# Patient Record
Sex: Female | Born: 1985 | Race: Black or African American | Hispanic: No | Marital: Single | Smoking: Never smoker
Health system: Southern US, Community
[De-identification: ages and names within clinical notes are randomized; demographics above are authoritative.]

## PROBLEM LIST (undated history)

## (undated) DIAGNOSIS — J90 Pleural effusion, not elsewhere classified: Secondary | ICD-10-CM

## (undated) DIAGNOSIS — I509 Heart failure, unspecified: Secondary | ICD-10-CM

## (undated) DIAGNOSIS — E119 Type 2 diabetes mellitus without complications: Secondary | ICD-10-CM

## (undated) DIAGNOSIS — E669 Obesity, unspecified: Secondary | ICD-10-CM

## (undated) DIAGNOSIS — N179 Acute kidney failure, unspecified: Secondary | ICD-10-CM

## (undated) DIAGNOSIS — R000 Tachycardia, unspecified: Secondary | ICD-10-CM

## (undated) DIAGNOSIS — J189 Pneumonia, unspecified organism: Secondary | ICD-10-CM

## (undated) DIAGNOSIS — I5021 Acute systolic (congestive) heart failure: Secondary | ICD-10-CM

## (undated) HISTORY — PX: LEFT VENTRICULAR ASSIST DEVICE: SHX2679

## (undated) HISTORY — PX: APPENDECTOMY: SHX54

---

## 2014-08-08 ENCOUNTER — Inpatient Hospital Stay (HOSPITAL_COMMUNITY): Payer: BC Managed Care – PPO

## 2014-08-08 ENCOUNTER — Inpatient Hospital Stay (HOSPITAL_COMMUNITY)
Admission: EM | Admit: 2014-08-08 | Discharge: 2014-08-19 | DRG: 291 | Disposition: A | Discharge status: Home / Self Care | Payer: BC Managed Care – PPO | Attending: Internal Medicine | Admitting: Internal Medicine

## 2014-08-08 ENCOUNTER — Emergency Department (HOSPITAL_COMMUNITY): Payer: BC Managed Care – PPO

## 2014-08-08 ENCOUNTER — Encounter (HOSPITAL_COMMUNITY): Payer: Self-pay | Admitting: Emergency Medicine

## 2014-08-08 DIAGNOSIS — J90 Pleural effusion, not elsewhere classified: Secondary | ICD-10-CM | POA: Diagnosis present

## 2014-08-08 DIAGNOSIS — E1165 Type 2 diabetes mellitus with hyperglycemia: Secondary | ICD-10-CM | POA: Diagnosis present

## 2014-08-08 DIAGNOSIS — K5900 Constipation, unspecified: Secondary | ICD-10-CM

## 2014-08-08 DIAGNOSIS — Y95 Nosocomial condition: Secondary | ICD-10-CM | POA: Diagnosis present

## 2014-08-08 DIAGNOSIS — R1115 Cyclical vomiting syndrome unrelated to migraine: Secondary | ICD-10-CM

## 2014-08-08 DIAGNOSIS — E876 Hypokalemia: Secondary | ICD-10-CM | POA: Diagnosis present

## 2014-08-08 DIAGNOSIS — N189 Chronic kidney disease, unspecified: Secondary | ICD-10-CM | POA: Diagnosis present

## 2014-08-08 DIAGNOSIS — D509 Iron deficiency anemia, unspecified: Secondary | ICD-10-CM | POA: Diagnosis present

## 2014-08-08 DIAGNOSIS — J189 Pneumonia, unspecified organism: Secondary | ICD-10-CM | POA: Diagnosis present

## 2014-08-08 DIAGNOSIS — N179 Acute kidney failure, unspecified: Secondary | ICD-10-CM | POA: Diagnosis present

## 2014-08-08 DIAGNOSIS — I5021 Acute systolic (congestive) heart failure: Secondary | ICD-10-CM | POA: Diagnosis not present

## 2014-08-08 DIAGNOSIS — I319 Disease of pericardium, unspecified: Secondary | ICD-10-CM

## 2014-08-08 DIAGNOSIS — D649 Anemia, unspecified: Secondary | ICD-10-CM | POA: Diagnosis present

## 2014-08-08 DIAGNOSIS — I3139 Other pericardial effusion (noninflammatory): Secondary | ICD-10-CM | POA: Diagnosis present

## 2014-08-08 DIAGNOSIS — E118 Type 2 diabetes mellitus with unspecified complications: Secondary | ICD-10-CM | POA: Diagnosis present

## 2014-08-08 DIAGNOSIS — I429 Cardiomyopathy, unspecified: Secondary | ICD-10-CM | POA: Diagnosis present

## 2014-08-08 DIAGNOSIS — R112 Nausea with vomiting, unspecified: Secondary | ICD-10-CM | POA: Diagnosis not present

## 2014-08-08 DIAGNOSIS — K5901 Slow transit constipation: Secondary | ICD-10-CM

## 2014-08-08 DIAGNOSIS — Z791 Long term (current) use of non-steroidal anti-inflammatories (NSAID): Secondary | ICD-10-CM

## 2014-08-08 DIAGNOSIS — R000 Tachycardia, unspecified: Secondary | ICD-10-CM | POA: Diagnosis present

## 2014-08-08 DIAGNOSIS — E119 Type 2 diabetes mellitus without complications: Secondary | ICD-10-CM

## 2014-08-08 DIAGNOSIS — I129 Hypertensive chronic kidney disease with stage 1 through stage 4 chronic kidney disease, or unspecified chronic kidney disease: Secondary | ICD-10-CM | POA: Diagnosis present

## 2014-08-08 DIAGNOSIS — J810 Acute pulmonary edema: Secondary | ICD-10-CM

## 2014-08-08 DIAGNOSIS — I313 Pericardial effusion (noninflammatory): Secondary | ICD-10-CM | POA: Diagnosis present

## 2014-08-08 DIAGNOSIS — Z833 Family history of diabetes mellitus: Secondary | ICD-10-CM

## 2014-08-08 DIAGNOSIS — E669 Obesity, unspecified: Secondary | ICD-10-CM | POA: Diagnosis present

## 2014-08-08 DIAGNOSIS — A599 Trichomoniasis, unspecified: Secondary | ICD-10-CM | POA: Diagnosis present

## 2014-08-08 DIAGNOSIS — R0602 Shortness of breath: Secondary | ICD-10-CM | POA: Diagnosis not present

## 2014-08-08 HISTORY — DX: Acute kidney failure, unspecified: N17.9

## 2014-08-08 HISTORY — DX: Pleural effusion, not elsewhere classified: J90

## 2014-08-08 HISTORY — DX: Acute systolic (congestive) heart failure: I50.21

## 2014-08-08 HISTORY — DX: Tachycardia, unspecified: R00.0

## 2014-08-08 HISTORY — DX: Type 2 diabetes mellitus without complications: E11.9

## 2014-08-08 HISTORY — DX: Pneumonia, unspecified organism: J18.9

## 2014-08-08 HISTORY — DX: Obesity, unspecified: E66.9

## 2014-08-08 LAB — CBC WITH DIFFERENTIAL/PLATELET
Basophils Absolute: 0 10*3/uL (ref 0.0–0.1)
Basophils Relative: 0 % (ref 0–1)
Eosinophils Absolute: 0.1 10*3/uL (ref 0.0–0.7)
Eosinophils Relative: 1 % (ref 0–5)
HCT: 28.3 % — ABNORMAL LOW (ref 36.0–46.0)
Hemoglobin: 10 g/dL — ABNORMAL LOW (ref 12.0–15.0)
Lymphocytes Relative: 16 % (ref 12–46)
Lymphs Abs: 1.4 10*3/uL (ref 0.7–4.0)
MCH: 29.9 pg (ref 26.0–34.0)
MCHC: 35.3 g/dL (ref 30.0–36.0)
MCV: 84.5 fL (ref 78.0–100.0)
Monocytes Absolute: 0.4 10*3/uL (ref 0.1–1.0)
Monocytes Relative: 5 % (ref 3–12)
Neutro Abs: 6.9 10*3/uL (ref 1.7–7.7)
Neutrophils Relative %: 78 % — ABNORMAL HIGH (ref 43–77)
Platelets: 312 10*3/uL (ref 150–400)
RBC: 3.35 MIL/uL — ABNORMAL LOW (ref 3.87–5.11)
RDW: 11.6 % (ref 11.5–15.5)
WBC: 8.9 10*3/uL (ref 4.0–10.5)

## 2014-08-08 LAB — TROPONIN I
Troponin I: <0.3 ng/mL (ref <0.30)
Troponin I: <0.3 ng/mL (ref <0.30)

## 2014-08-08 LAB — GLUCOSE, CAPILLARY
Glucose-Capillary: 203 mg/dL — ABNORMAL HIGH (ref 70–99)
Glucose-Capillary: 146 mg/dL — ABNORMAL HIGH (ref 70–99)
Glucose-Capillary: 196 mg/dL — ABNORMAL HIGH (ref 70–99)

## 2014-08-08 LAB — RETICULOCYTES
RBC.: 3.06 MIL/uL — ABNORMAL LOW (ref 3.87–5.11)
Retic Count, Absolute: 76.5 10*3/uL (ref 19.0–186.0)
Retic Ct Pct: 2.5 % (ref 0.4–3.1)

## 2014-08-08 LAB — BASIC METABOLIC PANEL
Anion gap: 13 (ref 5–15)
BUN: 8 mg/dL (ref 6–23)
CO2: 25 mEq/L (ref 19–32)
Calcium: 8.9 mg/dL (ref 8.4–10.5)
Chloride: 99 mEq/L (ref 96–112)
Creatinine, Ser: 0.79 mg/dL (ref 0.50–1.10)
GFR calc Af Amer: >90 mL/min (ref >90)
GFR calc non Af Amer: >90 mL/min (ref >90)
Glucose, Bld: 205 mg/dL — ABNORMAL HIGH (ref 70–99)
Potassium: 4.1 mEq/L (ref 3.7–5.3)
Sodium: 137 mEq/L (ref 137–147)

## 2014-08-08 LAB — IRON AND TIBC
Iron: 35 ug/dL — ABNORMAL LOW (ref 42–135)
Saturation Ratios: 17 % — ABNORMAL LOW (ref 20–55)
TIBC: 207 ug/dL — ABNORMAL LOW (ref 250–470)
UIBC: 172 ug/dL (ref 125–400)

## 2014-08-08 LAB — TSH: TSH: 1.14 u[IU]/mL (ref 0.350–4.500)

## 2014-08-08 LAB — HEMOGLOBIN A1C
Hgb A1c MFr Bld: 9.4 % — ABNORMAL HIGH (ref <5.7)
Mean Plasma Glucose: 223 mg/dL — ABNORMAL HIGH (ref <117)

## 2014-08-08 LAB — FOLATE: Folate: 13.6 ng/mL

## 2014-08-08 LAB — VITAMIN B12: Vitamin B-12: 305 pg/mL (ref 211–911)

## 2014-08-08 LAB — CBG MONITORING, ED: Glucose-Capillary: 194 mg/dL — ABNORMAL HIGH (ref 70–99)

## 2014-08-08 LAB — FERRITIN: Ferritin: 246 ng/mL (ref 10–291)

## 2014-08-08 LAB — POC URINE PREG, ED: Preg Test, Ur: NEGATIVE

## 2014-08-08 LAB — PRO B NATRIURETIC PEPTIDE: Pro B Natriuretic peptide (BNP): 2629 pg/mL — ABNORMAL HIGH (ref 0–125)

## 2014-08-08 MED ORDER — ONDANSETRON HCL 4 MG/2ML IJ SOLN
4.0000 mg | Freq: Four times a day (QID) | INTRAMUSCULAR | Status: DC | PRN
  Administered 2014-08-09 – 2014-08-17 (×14): 4 mg via INTRAVENOUS
  Filled 2014-08-08 (×19): qty 2

## 2014-08-08 MED ORDER — HYDROCODONE-ACETAMINOPHEN 5-325 MG PO TABS: 1.0000 | ORAL_TABLET | ORAL | Status: DC | PRN

## 2014-08-08 MED ORDER — CEFTRIAXONE SODIUM 1 G IJ SOLR
1.0000 g | Freq: Once | INTRAMUSCULAR | Status: AC
  Administered 2014-08-08: 1 g via INTRAVENOUS
  Filled 2014-08-08: qty 10

## 2014-08-08 MED ORDER — POTASSIUM CHLORIDE IN NACL 20-0.9 MEQ/L-% IV SOLN
INTRAVENOUS | Status: DC
Start: 2014-08-08 — End: 2014-08-08
  Administered 2014-08-08: 50 mL/h via INTRAVENOUS
  Filled 2014-08-08: qty 1000

## 2014-08-08 MED ORDER — ACETAMINOPHEN 650 MG RE SUPP: 650.0000 mg | Freq: Four times a day (QID) | RECTAL | Status: DC | PRN

## 2014-08-08 MED ORDER — ACETAMINOPHEN 325 MG PO TABS
650.0000 mg | ORAL_TABLET | Freq: Four times a day (QID) | ORAL | Status: DC | PRN
Start: 2014-08-08 — End: 2014-08-19

## 2014-08-08 MED ORDER — ONDANSETRON HCL 4 MG PO TABS
4.0000 mg | ORAL_TABLET | Freq: Four times a day (QID) | ORAL | Status: DC | PRN
  Filled 2014-08-08 (×4): qty 1

## 2014-08-08 MED ORDER — LEVALBUTEROL HCL 0.63 MG/3ML IN NEBU
0.6300 mg | INHALATION_SOLUTION | Freq: Four times a day (QID) | RESPIRATORY_TRACT | Status: DC
  Administered 2014-08-08 – 2014-08-10 (×10): 0.63 mg via RESPIRATORY_TRACT
  Filled 2014-08-08 (×11): qty 3

## 2014-08-08 MED ORDER — BENZONATATE 100 MG PO CAPS
100.0000 mg | ORAL_CAPSULE | Freq: Three times a day (TID) | ORAL | Status: DC
  Administered 2014-08-08 – 2014-08-09 (×5): 100 mg via ORAL
  Filled 2014-08-08 (×11): qty 1

## 2014-08-08 MED ORDER — GUAIFENESIN-DM 100-10 MG/5ML PO SYRP: 5.0000 mL | ORAL_SOLUTION | ORAL | Status: DC | PRN

## 2014-08-08 MED ORDER — ONDANSETRON 8 MG PO TBDP
8.0000 mg | ORAL_TABLET | Freq: Once | ORAL | Status: AC
  Administered 2014-08-08: 8 mg via ORAL
  Filled 2014-08-08: qty 1

## 2014-08-08 MED ORDER — CEFEPIME HCL 1 G IJ SOLR
1.0000 g | Freq: Two times a day (BID) | INTRAMUSCULAR | Status: DC
  Filled 2014-08-08 (×4): qty 1

## 2014-08-08 MED ORDER — ALUM & MAG HYDROXIDE-SIMETH 200-200-20 MG/5ML PO SUSP: 30.0000 mL | Freq: Four times a day (QID) | ORAL | Status: DC | PRN

## 2014-08-08 MED ORDER — AZITHROMYCIN 250 MG PO TABS
500.0000 mg | ORAL_TABLET | Freq: Every day | ORAL | Status: DC
  Filled 2014-08-08: qty 2

## 2014-08-08 MED ORDER — VANCOMYCIN HCL IN DEXTROSE 1-5 GM/200ML-% IV SOLN
1000.0000 mg | Freq: Three times a day (TID) | INTRAVENOUS | Status: DC
  Administered 2014-08-08 – 2014-08-10 (×6): 1000 mg via INTRAVENOUS
  Filled 2014-08-08 (×7): qty 200

## 2014-08-08 MED ORDER — MORPHINE SULFATE 2 MG/ML IJ SOLN
2.0000 mg | INTRAMUSCULAR | Status: DC | PRN
  Administered 2014-08-18: 2 mg via INTRAVENOUS
  Filled 2014-08-08: qty 1

## 2014-08-08 MED ORDER — ONDANSETRON HCL 4 MG/2ML IJ SOLN
4.0000 mg | Freq: Once | INTRAMUSCULAR | Status: AC
  Administered 2014-08-08: 4 mg via INTRAVENOUS
  Filled 2014-08-08: qty 2

## 2014-08-08 MED ORDER — IOHEXOL 300 MG/ML  SOLN
80.0000 mL | Freq: Once | INTRAMUSCULAR | Status: AC | PRN
  Administered 2014-08-08: 80 mL via INTRAVENOUS

## 2014-08-08 MED ORDER — ENOXAPARIN SODIUM 40 MG/0.4ML ~~LOC~~ SOLN
40.0000 mg | SUBCUTANEOUS | Status: DC
Start: 2014-08-08 — End: 2014-08-15
  Administered 2014-08-08 – 2014-08-15 (×8): 40 mg via SUBCUTANEOUS
  Filled 2014-08-08 (×8): qty 0.4

## 2014-08-08 MED ORDER — DEXTROSE 5 % IV SOLN
1.0000 g | Freq: Three times a day (TID) | INTRAVENOUS | Status: DC
  Administered 2014-08-08 – 2014-08-10 (×7): 1 g via INTRAVENOUS
  Filled 2014-08-08 (×7): qty 1

## 2014-08-08 MED ORDER — VANCOMYCIN HCL IN DEXTROSE 1-5 GM/200ML-% IV SOLN: 1000.0000 mg | Freq: Once | INTRAVENOUS | Status: DC

## 2014-08-08 MED ORDER — INSULIN ASPART 100 UNIT/ML ~~LOC~~ SOLN
0.0000 [IU] | Freq: Three times a day (TID) | SUBCUTANEOUS | Status: DC
  Administered 2014-08-08: 2 [IU] via SUBCUTANEOUS
  Administered 2014-08-08 – 2014-08-09 (×3): 5 [IU] via SUBCUTANEOUS

## 2014-08-08 MED ORDER — AZITHROMYCIN 250 MG PO TABS
500.0000 mg | ORAL_TABLET | Freq: Once | ORAL | Status: AC
  Administered 2014-08-08: 500 mg via ORAL
  Filled 2014-08-08: qty 2

## 2014-08-08 NOTE — ED Notes (Signed)
Pt tearing.  Parents at bedside.

## 2014-08-08 NOTE — Progress Notes (Signed)
Utilization review Completed Kiyoko Mcguirt RN BSN   

## 2014-08-08 NOTE — ED Notes (Signed)
Pt c/o n/v, cough and body aches x 1 week

## 2014-08-08 NOTE — ED Notes (Signed)
Dr Caryn Section informed of CT-scan results.  Plan MD to speak with pt and family.

## 2014-08-08 NOTE — Progress Notes (Signed)
ANTIBIOTIC CONSULT NOTE - INITIAL  Pharmacy Consult for Vancomycin & Cefepime Indication: pneumonia  No Known Allergies  Patient Measurements: Height: 5\' 2"  (157.5 cm) Weight: 179 lb (81.194 kg) IBW/kg (Calculated) : 50.1  Vital Signs: Temp: 98.4 F (36.9 C) (09/20 0928) Temp src: Oral (09/20 0928) BP: 126/89 mmHg (09/20 0928) Pulse Rate: 111 (09/20 0928) Intake/Output from previous day:   Intake/Output from this shift:    Labs:  Recent Labs  08/08/14 0416  WBC 8.9  HGB 10.0*  PLT 312  CREATININE 0.79   Estimated Creatinine Clearance: 105.1 ml/min (by C-G formula based on Cr of 0.79). No results found for this basename: VANCOTROUGH, VANCOPEAK, VANCORANDOM, GENTTROUGH, GENTPEAK, GENTRANDOM, TOBRATROUGH, TOBRAPEAK, TOBRARND, AMIKACINPEAK, AMIKACINTROU, AMIKACIN,  in the last 72 hours   Microbiology: No results found for this or any previous visit (from the past 720 hour(s)).  Medical History: Past Medical History  Diagnosis Date  . Diabetes mellitus without complication     Medications:  Scheduled:  . [START ON 08/09/2014] azithromycin  500 mg Oral Daily  . benzonatate  100 mg Oral TID  . ceFEPime (MAXIPIME) IV  1 g Intravenous Q8H  . enoxaparin (LOVENOX) injection  40 mg Subcutaneous Q24H  . insulin aspart  0-15 Units Subcutaneous TID WC  . levalbuterol  0.63 mg Nebulization Q6H  . vancomycin  1,000 mg Intravenous Q8H   Assessment: 28 yo F presented with cough, body aches, and nausea x1 week.  Chest CT +PNA.   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 She is afebrile with normal WBC.  No cx data obtained.  Renal function is at patient's baseline.   Vancomycin 9/20>> Cefepime 9/20>> Zithromax 9/20>>  Goal of Therapy:  Vancomycin trough level 15-20 mcg/ml  Plan:  Continue Zithromax per MD for atypical coverage Increase Cefepime 1gm IV q8h Vancomycin 1gm IV q8h Check Vancomycin trough at steady state Monitor renal  function and cx data   Biagio Borg 08/08/2014,9:46 AM

## 2014-08-08 NOTE — ED Provider Notes (Signed)
CSN: [id number]     Arrival date & time 08/08/14  0325 History   First MD Initiated Contact with Patient 08/08/14 [phone]     Chief Complaint  Patient presents with  . Cough     Patient is a 28 y.o. female presenting with cough. The history is provided by the patient.  Cough Severity:  Moderate Onset quality:  Gradual Duration:  1 week Timing:  Intermittent Progression:  Worsening Chronicity:  New Relieved by:  None tried Worsened by:  Nothing tried Associated symptoms: chills and myalgias   Patient presents for multiple complaints She reports worsening cough for past week She also reports myalgias and chills She also reports vomiting but no diarrhea She also reports "my side is hurting" and indicates her right flank  She has a history of diabetes  Past Medical History  Diagnosis Date  . Diabetes mellitus without complication    Past Surgical History  Procedure Laterality Date  . Appendectomy     History reviewed. No pertinent family history. History  Substance Use Topics  . Smoking status: Never Smoker   . Smokeless tobacco: Not on file  . Alcohol Use: No   OB History   Grav Para Term Preterm Abortions TAB SAB Ect Mult Living                 Review of Systems  Constitutional: Positive for chills.  Respiratory: Positive for cough.   Gastrointestinal: Positive for vomiting.  Genitourinary: Negative for dysuria.  Musculoskeletal: Positive for myalgias.  All other systems reviewed and are negative.     Allergies  Review of patient's allergies indicates no known allergies.  Home Medications   Prior to Admission medications   Not on File   BP 139/93  Pulse 109  Temp(Src) 98.5 F (36.9 C)  Resp 16  Ht 5\' 2"  (1.575 m)  Wt 178 lb (80.74 kg)  BMI 32.55 kg/m2  SpO2 96%  LMP 08/01/2014 Physical Exam CONSTITUTIONAL: pt resting comfortably with her head down and eyes closed.  She will speak but does not make eye contact HEAD:  Normocephalic/atraumatic EYES: EOMI/PERRL ENMT: Mucous membranes moist, uvula midline, no exudates or erythema noted NECK: supple no meningeal signs SPINE:entire spine nontender CV: S1/S2 noted, no murmurs/rubs/gallops noted LUNGS: poor effort noted.  She has crackles in right base.  Mild tachypnea  ABDOMEN: soft, nontender, no rebound or guarding GU:no cva tenderness NEURO: Pt is resting comfortably, she will speak but does not make eye contact, moves all extremitiesx4 EXTREMITIES: pulses normal, full ROM SKIN: warm, color normal   ED Course  Procedures  4:55 AM Pt is a very poor historian.  She does not make eye contact and contributes minimally to history.  However, she does endorse cough over past week with myalgias.  She did not record a fever at home.  She denies any recent hospitalizations Will order antibiotics for pneumonia 5:24 AM Pt with elevated BNP She has crackles bilaterally She denies active CP Her biggest complaint is cough.   Will admit for further monitoring.  She may have component of CHF and she does not appear to have a history of this in the past  5:39 AM I consulted [facility] for admission Dr Shanon Brow requests EKG/Troponin and cardiology consultation 6:11 AM Troponin negative EKG does not show any acute ST changes I spoke to Dr Radford Pax with cardiology Recommends diuresis, hospitalist admission, echo/cardiology consultation at [facility] I spoke to Dr Shanon Brow Will admit. She did not want bed  request ordered Labs Review Labs Reviewed  BASIC METABOLIC PANEL - Abnormal; Notable for the following:    Glucose, Bld 205 (*)    All other components within normal limits  CBC WITH DIFFERENTIAL - Abnormal; Notable for the following:    RBC 3.35 (*)    Hemoglobin 10.0 (*)    HCT 28.3 (*)    Neutrophils Relative % 78 (*)    All other components within normal limits  PRO B NATRIURETIC PEPTIDE - Abnormal; Notable for the following:    Pro B Natriuretic peptide  (BNP) 2629.0 (*)    All other components within normal limits  CBG MONITORING, ED - Abnormal; Notable for the following:    Glucose-Capillary 194 (*)    All other components within normal limits  TROPONIN I  URINALYSIS, ROUTINE W REFLEX MICROSCOPIC  POC URINE PREG, ED    Imaging Review Dg Chest 2 View  08/08/2014   CLINICAL DATA:  Cough, congestion, nausea, vomiting and chills.  EXAM: CHEST  2 VIEW  COMPARISON:  None.  FINDINGS: The lungs are well-aerated. Small bilateral pleural effusions are seen. Bibasilar airspace opacification may reflect pulmonary edema or multifocal pneumonia. No pneumothorax is seen.  The heart is borderline normal in size. No acute osseous abnormalities are seen.  IMPRESSION: Small bilateral pleural effusions seen. Bibasilar airspace opacification may reflect pulmonary edema or multifocal pneumonia.   Electronically Signed   By: Garald Balding M.D.   On: 08/08/2014 04:42    MDM   Final diagnoses:  CAP (community acquired pneumonia)  Acute pulmonary edema    Nursing notes including past medical history and social history reviewed and considered in documentation xrays reviewed and considered Labs/vital reviewed and considered     Sharyon Cable, MD 08/08/14 [phone]

## 2014-08-08 NOTE — ED Notes (Signed)
Dr Shanon Brow paged to [phone] for Dr Christy Gentles.

## 2014-08-08 NOTE — Progress Notes (Signed)
Echocardiogram 2D Echocardiogram has been performed.  Diana [name] 08/08/2014, 4:10 PM

## 2014-08-08 NOTE — ED Notes (Signed)
Dr Fisher at bedside. 

## 2014-08-08 NOTE — H&P (Signed)
[facility] History and Physical  Diana [name] [id number] DOB: 1986/05/12 DOA: 08/08/2014  Referring physician: ED physician, Dr. Christy Gentles PCP: [facility] in [place]  Chief Complaint: Shortness of breath and cough  HPI: Diana [name] is a 28 y.o. female with a history of diabetes mellitus, who presents to the emergency department with a chief complaint of shortness of breath and cough. She has been short of breath for more than one week, mostly with any type of activity. She has had associated orthopnea and has found it difficult to lay flat when sleeping. She has had some pleurisy, mostly on the right. No other chest pain. Her cough has been intermittently productive with yellow sputum. She has had chills but no fever. She had 2 episodes of nausea and vomiting without coffee grounds emesis. She denies diarrhea or abdominal pain. She denies pain with urination. She has had intermittent swelling in both legs which she attributes to standing on her feet at work. She has had a recent stuffy nose and a frontal headache. She denies tobacco and illicit drug use. She has had no prior history of heart disease or congestive heart failure.  In the emergency department, she was afebrile and sinus tachycardia, but her blood pressure was within normal limits. Her white blood cell count was within normal limits at 8.9. Her lab data were significant for a pro BNP of 2629 and a venous glucose of 205. Her urine pregnancy test was negative. Her chest x-ray revealed small bilateral pleural effusions and bibasilar airspace opacity which may reflect pulmonary edema or multifocal pneumonia per the radiologist.  There was some concern about new onset congestive heart failure. On call cardiologist at  [facility], Dr. Radford Pax, was called by the ED P. Dr. Christy Gentles for possible transfer. This was declined. Therefore, I decided to order a CT of her chest to decipher whether she had definitive  pneumonia or new onset congestive heart failure. The CT of her chest with contrast revealed multi-lobar pneumonia, moderate sized bilateral pleural effusions and a small pericardial effusion. She was admitted at [facility] for further evaluation and management.     Review of Systems:  As above in history present illness, otherwise review of systems is negative.  Past Medical History  Diagnosis Date  . Diabetes mellitus without complication    Past Surgical History  Procedure Laterality Date  . Appendectomy     Social History: She is single. She lives in [place]. She has no children. 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 She denies tobacco, alcohol, and illicit drug use.  No Known Allergies  Family history: Her mother is 42 years of age and has end-stage renal disease and diabetes mellitus. Her father is 48 years of age and is healthy.   Prior to Admission medications   Not on File  Metformin 500 mg daily.   Physical Exam: Filed Vitals:   08/08/14 0630 08/08/14 0700 08/08/14 0738 08/08/14 0749  BP:   142/97   Pulse: 110 103 116   Temp:    98.4 F (36.9 C)  TempSrc:    Oral  Resp: 21 12 24    Height:      Weight:      SpO2: 99% 96% 94%     Wt Readings from Last 3 Encounters:  08/08/14 80.74 kg (178 lb)   temperature 98.4. Respiratory rate 24. Heart rate 116. Oxygen saturation 94% on room air. Blood pressure 142/97.  General:  28 year old African-American woman who appears ill, but in no acute distress. Eyes: PERRL, normal lids, irises & conjunctiva; conjunctivae are clear and sclerae are white. Extraocular movements are intact. ENT: Oropharynx reveals moist mucous membranes, no exudates or erythema. Nasal mucosa is dry. Neck: Neck is supple and mildly obese; difficult to determine JVD, but appears unremarkable. Query mild thyromegaly. Cardiovascular: S1, S2, with a soft systolic murmur and questionable gallop. Telemetry:  Sinus tachycardia. Respiratory: Bilateral crackles in the mid lobes and bases. Breathing is nonlabored at rest. Abdomen: soft, positive bowel sounds, nontender, nondistended, mildly obese. Skin: no rash or induration seen on limited exam Musculoskeletal: grossly normal tone BUE/BLE Psychiatric: Very flat affect. Speech is clear. Alert and oriented x3. Neurologic: grossly non-focal; cranial nerves II through XII are intact. Strength is 5 over 5 throughout. Sensation is intact.           Labs on Admission:  Basic Metabolic Panel:  Recent Labs Lab 08/08/14 0416  NA 137  K 4.1  CL 99  CO2 25  GLUCOSE 205*  BUN 8  CREATININE 0.79  CALCIUM 8.9   Liver Function Tests: No results found for this basename: AST, ALT, ALKPHOS, BILITOT, PROT, ALBUMIN,  in the last 168 hours No results found for this basename: LIPASE, AMYLASE,  in the last 168 hours No results found for this basename: AMMONIA,  in the last 168 hours CBC:  Recent Labs Lab 08/08/14 0416  WBC 8.9  NEUTROABS 6.9  HGB 10.0*  HCT 28.3*  MCV 84.5  PLT 312   Cardiac Enzymes:  Recent Labs Lab 08/08/14 0456  TROPONINI <0.30    BNP (last 3 results)  Recent Labs  08/08/14 0456  PROBNP 2629.0*   CBG:  Recent Labs Lab 08/08/14 0350  GLUCAP 194*    Radiological Exams on Admission: Dg Chest 2 View  08/08/2014   CLINICAL DATA:  Cough, congestion, nausea, vomiting and chills.  EXAM: CHEST  2 VIEW  COMPARISON:  None.  FINDINGS: The lungs are well-aerated. Small bilateral pleural effusions are seen. Bibasilar airspace opacification may reflect pulmonary edema or multifocal pneumonia. No pneumothorax is seen.  The heart is borderline normal in size. No acute osseous abnormalities are seen.  IMPRESSION: Small bilateral pleural effusions seen. Bibasilar airspace opacification may reflect pulmonary edema or multifocal pneumonia.   Electronically Signed   By: Garald Balding M.D.   On: 08/08/2014 04:42   Ct Chest W  Contrast  08/08/2014   CLINICAL DATA:  Cough and right chest pain. Bibasilar airspace opacity and small bilateral pleural effusions on chest radiographs earlier today.  EXAM: CT CHEST WITH CONTRAST  TECHNIQUE: Multidetector CT imaging of the chest was performed during intravenous contrast administration.  CONTRAST:  70mL OMNIPAQUE IOHEXOL 300 MG/ML  SOLN  COMPARISON:  Chest radiographs obtained earlier today.  FINDINGS: Moderate-sized bilateral pleural effusions. Small pericardial effusion with a maximum thickness of 1.2 cm. Patchy and confluent airspace opacity in both lower lobes and in the right middle lobe. Mild thoracic spine degenerative changes. Unremarkable upper abdomen.  IMPRESSION: 1. Multilobar pneumonia. 2. Moderate-sized bilateral pleural effusions. 3. Small pericardial effusion.   Electronically Signed   By: Enrique Sack M.D.   On: 08/08/2014 08:07    EKG: Independently reviewed. Sinus tachycardia with a heart rate of 110 beats per minute.  Assessment/Plan Principal Problem:   HCAP (healthcare-associated pneumonia) Active Problems:   Pleural effusion, bilateral   Pericardial effusion   Type II or unspecified type diabetes mellitus without  mention of complication, not stated as uncontrolled   1. Healthcare associated multilobar pneumonia. The patient is employed at a nursing facility in [place], so will treat for healthcare associated pneumonia. She does not appear to be in extremis and is oxygenating 94% on room air. Surprisingly, she has neither a fever nor an elevated white blood cell count. There was a concern about congestive heart failure given her elevated proBNP. She does have bilateral pleural effusions and a small pericardial effusion per CT, so congestive heart failure is not completely ruled out. I believe this is mostly multifocal pneumonia with possible parapneumonic effusions. She received Rocephin and azithromycin in the ED. We'll continue antibiotic therapy with  cefepime, vancomycin, and oral Zithromax. We'll hydrate her gently. We'll add Tessalon Perles, oxygen, and Xopenex nebulizer for symptomatic treatment. We'll order strep pneumo antigen and Legionella antigen; blood cultures if she runs a fever of greater than 101. 2. Bilateral pleural effusions. These could be parapneumonic or transudative. We'll order a 2-D echocardiogram for further evaluation. 3. Small pericardial effusion per CT. 2-D echocardiogram will be ordered. 4. Type 2 diabetes mellitus. The patient was diagnosed with diabetes mellitus in 2005. She is treated chronically with metformin. This will be held in light of the contrast with the CT scan. We'll start gentle IV fluids and a sliding scale NovoLog. We'll order hemoglobin A1c. We'll also order TSH for further evaluation. 5. Normocytic anemia. We'll order an anemia panel for further evaluation and monitor.    Code Status: Full code DVT Prophylaxis: Lovenox Family Communication: Discussed with her parents on 9/20 Disposition Plan: Anticipate hospitalization for several days and discharged to home when medically appropriate.  Time spent: One hour and 15 minutes.  [facility] Pager [phone]

## 2014-08-08 NOTE — ED Notes (Signed)
Report given to Cindy RN on 300 

## 2014-08-09 ENCOUNTER — Inpatient Hospital Stay (HOSPITAL_COMMUNITY): Payer: BC Managed Care – PPO

## 2014-08-09 DIAGNOSIS — J810 Acute pulmonary edema: Secondary | ICD-10-CM

## 2014-08-09 DIAGNOSIS — R112 Nausea with vomiting, unspecified: Secondary | ICD-10-CM | POA: Diagnosis not present

## 2014-08-09 DIAGNOSIS — D649 Anemia, unspecified: Secondary | ICD-10-CM

## 2014-08-09 DIAGNOSIS — I5021 Acute systolic (congestive) heart failure: Secondary | ICD-10-CM | POA: Diagnosis present

## 2014-08-09 LAB — COMPREHENSIVE METABOLIC PANEL
ALT: 9 U/L (ref 0–35)
AST: 12 U/L (ref 0–37)
Albumin: 2.4 g/dL — ABNORMAL LOW (ref 3.5–5.2)
Alkaline Phosphatase: 71 U/L (ref 39–117)
Anion gap: 10 (ref 5–15)
BUN: 8 mg/dL (ref 6–23)
CO2: 26 mEq/L (ref 19–32)
Calcium: 8.5 mg/dL (ref 8.4–10.5)
Chloride: 102 mEq/L (ref 96–112)
Creatinine, Ser: 1.03 mg/dL (ref 0.50–1.10)
GFR calc Af Amer: 86 mL/min — ABNORMAL LOW (ref >90)
GFR calc non Af Amer: 74 mL/min — ABNORMAL LOW (ref >90)
Glucose, Bld: 191 mg/dL — ABNORMAL HIGH (ref 70–99)
Potassium: 4 mEq/L (ref 3.7–5.3)
Sodium: 138 mEq/L (ref 137–147)
Total Bilirubin: 0.3 mg/dL (ref 0.3–1.2)
Total Protein: 6.4 g/dL (ref 6.0–8.3)

## 2014-08-09 LAB — CBC
HCT: 25.5 % — ABNORMAL LOW (ref 36.0–46.0)
Hemoglobin: 8.7 g/dL — ABNORMAL LOW (ref 12.0–15.0)
MCH: 29 pg (ref 26.0–34.0)
MCHC: 34.1 g/dL (ref 30.0–36.0)
MCV: 85 fL (ref 78.0–100.0)
Platelets: 307 10*3/uL (ref 150–400)
RBC: 3 MIL/uL — ABNORMAL LOW (ref 3.87–5.11)
RDW: 11.6 % (ref 11.5–15.5)
WBC: 6.6 10*3/uL (ref 4.0–10.5)

## 2014-08-09 LAB — GLUCOSE, CAPILLARY
Glucose-Capillary: 237 mg/dL — ABNORMAL HIGH (ref 70–99)
Glucose-Capillary: 206 mg/dL — ABNORMAL HIGH (ref 70–99)
Glucose-Capillary: 151 mg/dL — ABNORMAL HIGH (ref 70–99)
Glucose-Capillary: 205 mg/dL — ABNORMAL HIGH (ref 70–99)

## 2014-08-09 LAB — LIPASE, BLOOD: Lipase: 42 U/L (ref 11–59)

## 2014-08-09 LAB — TROPONIN I: Troponin I: <0.3 ng/mL (ref <0.30)

## 2014-08-09 MED ORDER — LISINOPRIL 5 MG PO TABS
2.5000 mg | ORAL_TABLET | Freq: Every day | ORAL | Status: DC
  Administered 2014-08-09 – 2014-08-10 (×2): 2.5 mg via ORAL
  Filled 2014-08-09 (×2): qty 1

## 2014-08-09 MED ORDER — PROMETHAZINE HCL 25 MG/ML IJ SOLN
12.5000 mg | Freq: Four times a day (QID) | INTRAMUSCULAR | Status: DC | PRN
  Administered 2014-08-09 – 2014-08-11 (×3): 12.5 mg via INTRAVENOUS
  Filled 2014-08-09 (×3): qty 1

## 2014-08-09 MED ORDER — PANTOPRAZOLE SODIUM 40 MG IV SOLR
40.0000 mg | INTRAVENOUS | Status: DC
  Administered 2014-08-09 – 2014-08-12 (×3): 40 mg via INTRAVENOUS
  Filled 2014-08-09 (×3): qty 40

## 2014-08-09 MED ORDER — INSULIN ASPART 100 UNIT/ML ~~LOC~~ SOLN
0.0000 [IU] | Freq: Every day | SUBCUTANEOUS | Status: DC
  Administered 2014-08-09: 2 [IU] via SUBCUTANEOUS

## 2014-08-09 MED ORDER — INSULIN DETEMIR 100 UNIT/ML ~~LOC~~ SOLN
10.0000 [IU] | Freq: Every day | SUBCUTANEOUS | Status: DC
  Administered 2014-08-09: 10 [IU] via SUBCUTANEOUS
  Filled 2014-08-09 (×2): qty 0.1

## 2014-08-09 MED ORDER — INSULIN ASPART 100 UNIT/ML ~~LOC~~ SOLN
0.0000 [IU] | Freq: Three times a day (TID) | SUBCUTANEOUS | Status: DC
  Administered 2014-08-10: 3 [IU] via SUBCUTANEOUS
  Administered 2014-08-10: 2 [IU] via SUBCUTANEOUS
  Administered 2014-08-10: 5 [IU] via SUBCUTANEOUS
  Administered 2014-08-11 – 2014-08-13 (×5): 2 [IU] via SUBCUTANEOUS
  Administered 2014-08-14 (×2): 3 [IU] via SUBCUTANEOUS
  Administered 2014-08-14 – 2014-08-15 (×2): 2 [IU] via SUBCUTANEOUS
  Administered 2014-08-15: 3 [IU] via SUBCUTANEOUS
  Administered 2014-08-15 – 2014-08-19 (×6): 2 [IU] via SUBCUTANEOUS

## 2014-08-09 MED ORDER — FAMOTIDINE 20 MG PO TABS
20.0000 mg | ORAL_TABLET | Freq: Every day | ORAL | Status: DC
  Administered 2014-08-09: 20 mg via ORAL
  Filled 2014-08-09: qty 1

## 2014-08-09 MED ORDER — FUROSEMIDE 10 MG/ML IJ SOLN
20.0000 mg | Freq: Two times a day (BID) | INTRAMUSCULAR | Status: DC
  Administered 2014-08-09 – 2014-08-10 (×2): 20 mg via INTRAVENOUS
  Filled 2014-08-09 (×2): qty 2

## 2014-08-09 NOTE — Consult Note (Signed)
Consulting cardiologist: Dr Carlyle Dolly  Clinical Summary Ms. Baillargeon is a 28 y.o.female hx of DM admitted with SOB and cough. Reports SOB x 1 week, mainly DOE with low levels of activity. +Orthopnea. Intermittent productive cough. + LE edema for longer period of time, approx 2-3 months. Describes pleuritic right sided chest pain with coughing, no other chest pains. She denies any family history of cardiomyopathy, denies any EtOH or drug abuse, no recent prengancies.    - CXR small bilateral effusions, bibasilar airspace opacification likely pulmonary edema vs multiofocal pneumonia - CT chest multilobar pneumonia, moderate bilateral effusions, small pericardial effusions. - TSH 1.14, Hgb 10, WBC 8.9, plt 312, pro-BNP 2629, K 4.1, Cr 0.79, trop neg x2.  - EKG sinus tach  Patient admitted and started on abx for pneumonia. Echo showed LVEF 20-25%, a new finding for her, along with small pericardial effusion. Dilated IVC consistent with elevated RA pressure and increased CVP.     No Known Allergies  Medications Scheduled Medications: . benzonatate  100 mg Oral TID  . ceFEPime (MAXIPIME) IV  1 g Intravenous Q8H  . enoxaparin (LOVENOX) injection  40 mg Subcutaneous Q24H  . famotidine  20 mg Oral Daily  . furosemide  20 mg Intravenous Q12H  . insulin aspart  0-15 Units Subcutaneous TID WC  . levalbuterol  0.63 mg Nebulization Q6H  . lisinopril  2.5 mg Oral Daily  . vancomycin  1,000 mg Intravenous Q8H     Infusions:     PRN Medications:  acetaminophen, acetaminophen, alum & mag hydroxide-simeth, guaiFENesin-dextromethorphan, HYDROcodone-acetaminophen, morphine injection, ondansetron (ZOFRAN) IV, ondansetron, promethazine   Past Medical History  Diagnosis Date  . Diabetes mellitus without complication     Past Surgical History  Procedure Laterality Date  . Appendectomy      History reviewed. No pertinent family history.  Social History Ms. Mcbeth reports that  she has never smoked. She does not have any smokeless tobacco history on file. Ms. Suber reports that she does not drink alcohol.  Review of Systems CONSTITUTIONAL: No weight loss, fever, chills, weakness or fatigue.  HEENT: Eyes: No visual loss, blurred vision, double vision or yellow sclerae. No hearing loss, sneezing, congestion, runny nose or sore throat.  SKIN: No rash or itching.  CARDIOVASCULAR: per HPI  RESPIRATORY: + SOB, cough with sputum GASTROINTESTINAL: No anorexia, nausea, vomiting or diarrhea. No abdominal pain or blood.  GENITOURINARY: no polyuria, no dysuria NEUROLOGICAL: No headache, dizziness, syncope, paralysis, ataxia, numbness or tingling in the extremities. No change in bowel or bladder control.  MUSCULOSKELETAL: No muscle, back pain, joint pain or stiffness.  HEMATOLOGIC: No anemia, bleeding or bruising.  LYMPHATICS: No enlarged nodes. No history of splenectomy.  PSYCHIATRIC: No history of depression or anxiety.      Physical Examination Blood pressure 118/78, pulse 104, temperature 98.5 F (36.9 C), temperature source Oral, resp. rate 13, height 5\' 2"  (1.575 m), weight 203 lb 9.6 oz (92.352 kg), last menstrual period 08/01/2014, SpO2 100.00%.  Intake/Output Summary (Last 24 hours) at 08/09/14 1501 Last data filed at 08/09/14 0641  Gross per 24 hour  Intake 1107.5 ml  Output      0 ml  Net 1107.5 ml    HEENT: sclera clear  Cardiovascular: regular, tachy 110 no m/r/g, no JVD  Respiratory: decreased breath sounds bilateral bases  GI: abdomen soft, NT, ND  MSK: trace bilateral edema  Neuro: no focal deficits  Psych: appropriate affect   Lab Results  Basic Metabolic Panel:  Recent Labs Lab 08/08/14 0416 08/09/14 0538  NA 137 138  K 4.1 4.0  CL 99 102  CO2 25 26  GLUCOSE 205* 191*  BUN 8 8  CREATININE 0.79 1.03  CALCIUM 8.9 8.5    Liver Function Tests:  Recent Labs Lab 08/09/14 0538  AST 12  ALT 9  ALKPHOS 71  BILITOT 0.3   PROT 6.4  ALBUMIN 2.4*    CBC:  Recent Labs Lab 08/08/14 0416 08/09/14 0538  WBC 8.9 6.6  NEUTROABS 6.9  --   HGB 10.0* 8.7*  HCT 28.3* 25.5*  MCV 84.5 85.0  PLT 312 307    Cardiac Enzymes:  Recent Labs Lab 08/08/14 0456 08/08/14 0857  TROPONINI <0.30 <0.30    BNP: No components found with this basename: POCBNP,    Imaging 07/2014 Echo Study Conclusions  - Left ventricle: The cavity size was mildly dilated. Wall thickness was normal. Systolic function was severely reduced. The estimated ejection fraction was in the range of 20% to 25%. - Mitral valve: There was mild regurgitation. - Pericardium, extracardiac: A small pericardial effusion was identified.   Impression/Recommendations  1. Acute systolic heart failure - echo LVEF 20-25%, global hypokinesis, small pericardial effusion - TSH 1.14, EKG non-ischemic, negative troponins - chest imaging with evidence of multilobar pneumonia, this is likely a viral induced respiratory infection with viral induced CM, vs bacterial or atypical pneumonia with stress induced CM - she is + 1.6 liters since admission, she has received lasix 20mg  IV x 1 starting this afternoon. Evidence of pleural effusions, elevated BNP, and volume overload on exam - agree with lasix IV 20mg  bid, she is lasix naive and this is a reasonable starting dose. She has been started on lisinopril 2.5mg  daily, once more euvolemic will start coreg 3.12mg  bid, and potentially aldactone pending her hemodynamic response - fairly low likelihood of ischemic CM, no plans for ischemic testing at this time  2. Sinus tachycardia - physiologic response to acute infection and acute CHF. No arrhythmias noted on telemetry - continue to follow, as acute systemic illness improves so should HR     Carlyle Dolly, M.D.

## 2014-08-09 NOTE — Progress Notes (Signed)
[facility] PROGRESS NOTE  Diana [name] [id number] DOB: [birth year] DOA: 08/08/2014 PCP: Pcp Not In System  Assessment/Plan: 1. Healthcare associated multilobar pneumonia. 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 Oxygen saturation level  Range 94-100% on room air. She remains afebrile with no leukocytosis. There was a concern about congestive heart failure given her elevated proBNP and echo reveals EF 20-25% with mild mitral valve regurg and small pericardial effusion. Continue  cefepime, vancomycin, and oral Zithromax day #2. She has been gently hydrated and will hold of on any further IV fluids for now. Continue Tessalon Perles, oxygen, and Xopenex nebulizer. await strep pneumo antigen and Legionella antigen; blood cultures if she runs a fever of greater than 101. 2. Bilateral pleural effusions. These could be parapneumonic or transudative.   3. Small pericardial effusion per CT and confirmed with echo. ECG with sinus tach at rate of 110 and borderline prolonged QT. Denies chest pain. 4. Type 2 diabetes mellitus. The patient was diagnosed with diabetes mellitus in 2005 and takes metformin at home. HgA1c 9.4. Continue to hold this. CBG range 206-237. Will add Levemir and continue SSI 5. Normocytic anemia. Hg trending down after IV hydration.  Iron 35, TIBC 207 RBC 3.06 otherwise anemia panel within the limits of normal. Consider iron supplement. Consider transfusion if no improvement. sats remain .90% on room air.  6. Acute systolic HF: echo with EF 20-25%. May be related to #1 and #2. Will start lasix and lisinopril low dose as BP somewhat soft. Request cardiology consult.  7. Nausea/vomiting. Likely related to #1. No coffee ground emesis. Provide zofran as needed and pepcid.     Code Status: full Family Communication: none present Disposition Plan: home when improved clinicaly   Consultants:  cardiology  Procedures: Echo on [id number] reveals Systolic  function was severely reduced. The estimated ejection fraction was in the range of 20% to 25%. Mitral valve: There was mild regurgitation. Pericardium, extracardiac: A small pericardial effusion was identified.     Antibiotics: Vancomycin 08/08/14>> Cefepime 08/08/14>>  HPI/Subjective: Sitting up in bed. Complains of "just not feeling well but a little better than yesterday". Reports nausea with small amount emesis this am  Objective: Filed Vitals:   08/09/14 0623  BP: 118/78  Pulse: 104  Temp: 98.5 F (36.9 C)  Resp: 13    Intake/Output Summary (Last 24 hours) at 08/09/14 1417 Last data filed at 08/09/14 0641  Gross per 24 hour  Intake 1107.5 ml  Output      0 ml  Net 1107.5 ml   Filed Weights   08/08/14 0334 08/08/14 0928 08/09/14 0623  Weight: 80.74 kg (178 lb) 81.194 kg (179 lb) 92.352 kg (203 lb 9.6 oz)    Exam:   General:  Well nourished and somewhat ill appearing  Cardiovascular: S1 and S2. +murmur no gallup or rub. Trace LE edema  Respiratory: normal effort but shallow. BS distant with faint crackles bilateral bases  Abdomen: soft non-distended +BS non-tender  Musculoskeletal: no clubbing or cyanosis   Data Reviewed: Basic Metabolic Panel:  Recent Labs Lab 08/08/14 0416 08/09/14 0538  NA 137 138  K 4.1 4.0  CL 99 102  CO2 25 26  GLUCOSE 205* 191*  BUN 8 8  CREATININE 0.79 1.03  CALCIUM 8.9 8.5   Liver Function Tests:  Recent Labs Lab 08/09/14 0538  AST 12  ALT 9  ALKPHOS 71  BILITOT 0.3  PROT 6.4  ALBUMIN 2.4*   No results found  for this basename: LIPASE, AMYLASE,  in the last 168 hours No results found for this basename: AMMONIA,  in the last 168 hours CBC:  Recent Labs Lab 08/08/14 0416 08/09/14 0538  WBC 8.9 6.6  NEUTROABS 6.9  --   HGB 10.0* 8.7*  HCT 28.3* 25.5*  MCV 84.5 85.0  PLT 312 307   Cardiac Enzymes:  Recent Labs Lab 08/08/14 0456 08/08/14 0857  TROPONINI <0.30 <0.30   BNP (last 3 results)  Recent  Labs  08/08/14 0456  PROBNP 2629.0*   CBG:  Recent Labs Lab 08/08/14 1200 08/08/14 1640 08/08/14 2116 08/09/14 0738 08/09/14 1147  GLUCAP 203* 146* 196* 237* 206*    No results found for this or any previous visit (from the past 240 hour(s)).   Studies: Dg Chest 2 View  08/08/2014   CLINICAL DATA:  Cough, congestion, nausea, vomiting and chills.  EXAM: CHEST  2 VIEW  COMPARISON:  None.  FINDINGS: The lungs are well-aerated. Small bilateral pleural effusions are seen. Bibasilar airspace opacification may reflect pulmonary edema or multifocal pneumonia. No pneumothorax is seen.  The heart is borderline normal in size. No acute osseous abnormalities are seen.  IMPRESSION: Small bilateral pleural effusions seen. Bibasilar airspace opacification may reflect pulmonary edema or multifocal pneumonia.   Electronically Signed   By: Garald Balding M.D.   On: 08/08/2014 04:42   Ct Chest W Contrast  08/08/2014   CLINICAL DATA:  Cough and right chest pain. Bibasilar airspace opacity and small bilateral pleural effusions on chest radiographs earlier today.  EXAM: CT CHEST WITH CONTRAST  TECHNIQUE: Multidetector CT imaging of the chest was performed during intravenous contrast administration.  CONTRAST:  45mL OMNIPAQUE IOHEXOL 300 MG/ML  SOLN  COMPARISON:  Chest radiographs obtained earlier today.  FINDINGS: Moderate-sized bilateral pleural effusions. Small pericardial effusion with a maximum thickness of 1.2 cm. Patchy and confluent airspace opacity in both lower lobes and in the right middle lobe. Mild thoracic spine degenerative changes. Unremarkable upper abdomen.  IMPRESSION: 1. Multilobar pneumonia. 2. Moderate-sized bilateral pleural effusions. 3. Small pericardial effusion.   Electronically Signed   By: Enrique Sack M.D.   On: 08/08/2014 08:07    Scheduled Meds: . benzonatate  100 mg Oral TID  . ceFEPime (MAXIPIME) IV  1 g Intravenous Q8H  . enoxaparin (LOVENOX) injection  40 mg Subcutaneous  Q24H  . famotidine  20 mg Oral Daily  . furosemide  20 mg Intravenous Q12H  . insulin aspart  0-15 Units Subcutaneous TID WC  . levalbuterol  0.63 mg Nebulization Q6H  . lisinopril  2.5 mg Oral Daily  . vancomycin  1,000 mg Intravenous Q8H   Continuous Infusions:   Principal Problem:   HCAP (healthcare-associated pneumonia) Active Problems:   Pleural effusion, bilateral   Pericardial effusion   Type II or unspecified type diabetes mellitus without mention of complication, not stated as uncontrolled   Anemia, unspecified   Nausea with vomiting    Time spent: 35 minutes    [facility] Pager [phone]. If 7PM-7AM, please contact night-coverage at [email or web address], password [facility] 08/09/2014, 2:17 PM  LOS: 1 day

## 2014-08-09 NOTE — Progress Notes (Signed)
Inpatient Diabetes Program Recommendations  AACE/ADA: New Consensus Statement on Inpatient Glycemic Control (2013)  Target Ranges:  Prepandial:   less than 140 mg/dL      Peak postprandial:   less than 180 mg/dL (1-2 hours)      Critically ill patients:  140 - 180 mg/dL   Reason for Assessment: Elevated A1C and Hyperglycemia   Diabetes history: Type 2 diabetes Outpatient Diabetes medications: Metformin 500 mg qd Current orders for Inpatient glycemic control: Novolog Moderate correction scale  Results for Diana [name], Diana [name] (MRN [id number]) as of 08/09/2014 10:08  Ref. Range 08/08/2014 03:50 08/08/2014 12:00 08/08/2014 16:40 08/08/2014 21:16 08/09/2014 07:38  Glucose-Capillary Latest Range: 70-99 mg/dL 194 (H) 203 (H) 146 (H) 196 (H) 237 (H)   Note:  Despite Moderate Novolog correction that started at noon yesterday, CBG sub-optimal this AM at 237 mg/dl.  While in the hospital setting, suggest Lantus 18 units be started.    Need to assess patient for possible Outpatient Diabetes Education after discharge to address patient behaviors that could improve glycemic control.  Also, discharge medications will to be assessed-- can Metformin be increased to a more therapeutic dose?, does another oral agent such as a DPP-4 need to be added?, does insulin for home need to be considered?  Thank you.  Aiysha Jillson S. Marcelline Mates, RN, CNS, CDE Inpatient Diabetes Program, team pager [phone]

## 2014-08-09 NOTE — Progress Notes (Signed)
The patient was seen and examined. Her chart, vital signs, and laboratory studies were reviewed. She was discussed with nurse practitioner, Ms. Renard Hamper. Agree with her assessment and plan as discussed with additions below.  The patient's echocardiogram revealed an ejection fraction of 20-25%, indicating newly diagnosed acute congestive heart failure/cardiomyopathy of unknown etiology. Her bilateral pleural effusions could be transudative from the congestive heart failure. Given this finding, Lasix and lisinopril were started. Cardiology was consulted. In cardiologist, Dr. Nelly Laurence assessment and recommendations are noted and appreciated. He added Coreg 3.125 mg twice a day. Aldactone may be added later. 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  The patient had a couple episodes of nausea and vomiting today. Her lipase was within normal limits. Her urine pregnancy test on admission was negative. Her abdominal x-ray was unremarkable. Her diet was downgraded to clear liquids. She was started on IV protonix and as needed antiemetics. Oral Zithromax was discontinued as her pneumonia is likely more healthcare associated the community associated.  Worsening anemia. This is likely secondary to hemodilution. She has a history of iron deficiency anemia. She also reports heavy menstrual periods. She was on her menstrual period last week. We'll consider restarting oral iron supplementation which she has not nauseated.  Diabetes mellitus. Her capillary blood glucoses not optimal. We'll change sliding scale coverage to q. a.c. and each bedtime and add Levemir.

## 2014-08-10 ENCOUNTER — Encounter (HOSPITAL_COMMUNITY): Payer: Self-pay | Admitting: Internal Medicine

## 2014-08-10 DIAGNOSIS — N179 Acute kidney failure, unspecified: Secondary | ICD-10-CM | POA: Diagnosis not present

## 2014-08-10 DIAGNOSIS — R000 Tachycardia, unspecified: Secondary | ICD-10-CM | POA: Diagnosis present

## 2014-08-10 LAB — CBC
HCT: 26 % — ABNORMAL LOW (ref 36.0–46.0)
Hemoglobin: 8.9 g/dL — ABNORMAL LOW (ref 12.0–15.0)
MCH: 29.2 pg (ref 26.0–34.0)
MCHC: 34.2 g/dL (ref 30.0–36.0)
MCV: 85.2 fL (ref 78.0–100.0)
Platelets: 316 10*3/uL (ref 150–400)
RBC: 3.05 MIL/uL — ABNORMAL LOW (ref 3.87–5.11)
RDW: 11.7 % (ref 11.5–15.5)
WBC: 7.9 10*3/uL (ref 4.0–10.5)

## 2014-08-10 LAB — BASIC METABOLIC PANEL
Anion gap: 10 (ref 5–15)
BUN: 11 mg/dL (ref 6–23)
CO2: 26 mEq/L (ref 19–32)
Calcium: 8.6 mg/dL (ref 8.4–10.5)
Chloride: 102 mEq/L (ref 96–112)
Creatinine, Ser: 1.7 mg/dL — ABNORMAL HIGH (ref 0.50–1.10)
GFR calc Af Amer: 47 mL/min — ABNORMAL LOW (ref >90)
GFR calc non Af Amer: 41 mL/min — ABNORMAL LOW (ref >90)
Glucose, Bld: 195 mg/dL — ABNORMAL HIGH (ref 70–99)
Potassium: 3.6 mEq/L — ABNORMAL LOW (ref 3.7–5.3)
Sodium: 138 mEq/L (ref 137–147)

## 2014-08-10 LAB — HEPATIC FUNCTION PANEL
ALT: 7 U/L (ref 0–35)
AST: 12 U/L (ref 0–37)
Albumin: 2.3 g/dL — ABNORMAL LOW (ref 3.5–5.2)
Alkaline Phosphatase: 65 U/L (ref 39–117)
Bilirubin, Direct: <0.2 mg/dL (ref 0.0–0.3)
Total Bilirubin: 0.2 mg/dL — ABNORMAL LOW (ref 0.3–1.2)
Total Protein: 6.1 g/dL (ref 6.0–8.3)

## 2014-08-10 LAB — GLUCOSE, CAPILLARY
Glucose-Capillary: 203 mg/dL — ABNORMAL HIGH (ref 70–99)
Glucose-Capillary: 147 mg/dL — ABNORMAL HIGH (ref 70–99)
Glucose-Capillary: 171 mg/dL — ABNORMAL HIGH (ref 70–99)
Glucose-Capillary: 148 mg/dL — ABNORMAL HIGH (ref 70–99)
Glucose-Capillary: 107 mg/dL — ABNORMAL HIGH (ref 70–99)

## 2014-08-10 LAB — VANCOMYCIN, TROUGH: Vancomycin Tr: 48.2 ug/mL (ref 10.0–20.0)

## 2014-08-10 MED ORDER — POTASSIUM CHLORIDE CRYS ER 20 MEQ PO TBCR
40.0000 meq | EXTENDED_RELEASE_TABLET | ORAL | Status: DC
  Filled 2014-08-10: qty 2

## 2014-08-10 MED ORDER — CARVEDILOL 3.125 MG PO TABS
3.1250 mg | ORAL_TABLET | Freq: Two times a day (BID) | ORAL | Status: DC
  Administered 2014-08-10 – 2014-08-11 (×4): 3.125 mg via ORAL
  Filled 2014-08-10 (×5): qty 1

## 2014-08-10 MED ORDER — DEXTROSE 5 % IV SOLN
2.0000 g | INTRAVENOUS | Status: DC
  Administered 2014-08-11: 2 g via INTRAVENOUS
  Filled 2014-08-10 (×2): qty 2

## 2014-08-10 MED ORDER — POTASSIUM CHLORIDE CRYS ER 20 MEQ PO TBCR
20.0000 meq | EXTENDED_RELEASE_TABLET | ORAL | Status: AC
  Filled 2014-08-10: qty 1

## 2014-08-10 MED ORDER — INSULIN DETEMIR 100 UNIT/ML ~~LOC~~ SOLN
14.0000 [IU] | Freq: Every day | SUBCUTANEOUS | Status: DC
  Administered 2014-08-10 – 2014-08-18 (×6): 14 [IU] via SUBCUTANEOUS
  Filled 2014-08-10 (×10): qty 0.14

## 2014-08-10 MED ORDER — FUROSEMIDE 10 MG/ML IJ SOLN: 40.0000 mg | Freq: Two times a day (BID) | INTRAMUSCULAR | Status: DC

## 2014-08-10 MED ORDER — LIVING WELL WITH DIABETES BOOK
Freq: Once | Status: AC
  Administered 2014-08-11: 16:00:00
  Filled 2014-08-10 (×2): qty 1

## 2014-08-10 NOTE — Progress Notes (Signed)
CRITICAL VALUE ALERT  Critical value received:  Vancomycin Trough 48.2  Date of notification:  08/10/2014  Time of notification:  10:23AM  Critical value read back:Yes.    Nurse who received alert:  Cecilie Kicks, RN  MD notified (1st page):  Dr. Caryn Section   Time of first page:  10:26AM  MD notified (2nd page):  Time of second page:  Responding MD:    Time MD responded:

## 2014-08-10 NOTE — Progress Notes (Addendum)
The patient is a 28 year old woman with a history of diabetes, who was admitted for shortness of breath. She was found to have multifocal pneumonia per CT of the chest and acute systolic congestive heart failure with an EF of 20-25% per 2-D echocardiogram-new finding. The patient was seen and examined. She was discussed with nurse practitioner, Ms. Renard Hamper. Agree with her assessment and plan with additions below.  The patient is feeling better today. She she has less nausea and has had no vomiting over the past few hours. Her lipase and liver transaminases were within normal limits. Her abdominal x-ray was unremarkable. She is being treated with Protonix and as needed Zofran. Consider potassium chloride supplements could be the culprit of nausea and vomiting. We'll need to assess for a temporal relationship. We'll decrease the dose potassium chloride to 20 mEq twice a day and continue to monitor her serum potassium daily.  She has developed acute kidney injury. This was discussed with cardiology. Agree with holding the Lasix and the ACE inhibitor today. Etiology could be vancomycin-induced, contrast dye-induced, or diuresis. Vancomycin trough was elevated. We'll continue to monitor her urine output and follow her renal function closely. Will discontinue Vanco. If her renal function worsens, would consult nephrology.

## 2014-08-10 NOTE — Progress Notes (Signed)
Patient ID: Diana [name], female   DOB: March 09, 1986, 28 y.o.   MRN: [id number]    Subjective:    Subjectively she is feeling better. Cough and SOB improving.    Objective:   Temp:  [98.3 F (36.8 C)-98.9 F (37.2 C)] 98.4 F (36.9 C) (09/22 [id number]) Pulse Rate:  [101-116] 101 (09/22 0642) Resp:  [16] 16 (09/22 0642) BP: (115-125)/(81-87) 115/84 mmHg (09/22 0642) SpO2:  [95 %-100 %] 96 % (09/22 0736) Weight:  [205 lb 12.8 oz (93.35 kg)] 205 lb 12.8 oz (93.35 kg) (09/22 0642) Last BM Date: 08/06/14  Filed Weights   08/08/14 0928 08/09/14 [id number] 08/10/14 [id number]  Weight: 179 lb (81.194 kg) 203 lb 9.6 oz (92.352 kg) 205 lb 12.8 oz (93.35 kg)    Intake/Output Summary (Last 24 hours) at 08/10/14 0847 Last data filed at 08/10/14 0615  Gross per 24 hour  Intake   1230 ml  Output    800 ml  Net    430 ml    Telemetry: sinus tach low 100s  Exam:  General:NAD  Resp: CTAB  Cardiac: regular, rate 105, no m/r/g, no JVD  [id number] soft, NT, ND  MSK: no LE edema  Neuro: no focal deficits  Psych: appropriate affect  Lab Results:  Basic Metabolic Panel:  Recent Labs Lab 08/08/14 0416 08/09/14 0538 08/10/14 0548  NA 137 138 138  K 4.1 4.0 3.6*  CL 99 102 102  CO2 25 26 26   GLUCOSE 205* 191* 195*  BUN 8 8 11   CREATININE 0.79 1.03 1.70*  CALCIUM 8.9 8.5 8.6    Liver Function Tests:  Recent Labs Lab 08/09/14 0538  AST 12  ALT 9  ALKPHOS 71  BILITOT 0.3  PROT 6.4  ALBUMIN 2.4*    CBC:  Recent Labs Lab 08/08/14 0416 08/09/14 0538 08/10/14 0548  WBC 8.9 6.6 7.9  HGB 10.0* 8.7* 8.9*  HCT 28.3* 25.5* 26.0*  MCV 84.5 85.0 85.2  PLT 312 307 316    Cardiac Enzymes:  Recent Labs Lab 08/08/14 0456 08/08/14 0857 08/09/14 1551  TROPONINI <0.30 <0.30 <0.30    BNP:  Recent Labs  08/08/14 0456  PROBNP 2629.0*    Coagulation: No results found for this basename: INR,  in the last 168 hours  ECG:   Medications:   Scheduled Medications: .  benzonatate  100 mg Oral TID  . ceFEPime (MAXIPIME) IV  1 g Intravenous Q8H  . enoxaparin (LOVENOX) injection  40 mg Subcutaneous Q24H  . furosemide  20 mg Intravenous Q12H  . insulin aspart  0-15 Units Subcutaneous TID WC  . insulin aspart  0-5 Units Subcutaneous QHS  . insulin detemir  10 Units Subcutaneous QHS  . levalbuterol  0.63 mg Nebulization Q6H  . lisinopril  2.5 mg Oral Daily  . pantoprazole (PROTONIX) IV  40 mg Intravenous Q24H  . vancomycin  1,000 mg Intravenous Q8H     Infusions:     PRN Medications:  acetaminophen, acetaminophen, alum & mag hydroxide-simeth, guaiFENesin-dextromethorphan, HYDROcodone-acetaminophen, morphine injection, ondansetron (ZOFRAN) IV, ondansetron, promethazine     Assessment/Plan   1. Acute systolic heart failure  - echo LVEF 20-25%, global hypokinesis, small pericardial effusion  - TSH 1.14, EKG non-ischemic, negative troponins  - chest imaging with evidence of multilobar pneumonia, this is likely a viral induced respiratory infection with viral induced CM, vs bacterial or atypical pneumonia with stress induced CM  - started on IV lasix 20mg  bid yesterday, she has received 2 doses thus far. +  430 yesterday by charting, however no urine output is documented from 1st shift (weight 203 to 205). + 2 liters since admission. By exam volume status appears improved. Cr up to 1.7, unclear if due to diuresis or potentially AKI from infection or abx. Stop lisinopril for now, start low dose coreg 3.125mg  bid.  - fairly low likelihood of ischemic CM, no plans for ischemic testing at this time   2. Sinus tachycardia  - physiologic response to acute infection and acute CHF. No arrhythmias noted on telemetry  - continue to follow, as acute systemic illness improves so should HR  3. AKI - unclear etiology, unclear if related to diuresis (she is reported net positive however 1st shift uop not documented, her weight however has increased) or if related to  infection or Abx - consider urine studies with FeUrea. - will stop ACE-I, hold diuretics today       Carlyle Dolly, M.D.

## 2014-08-10 NOTE — Progress Notes (Signed)
ANTIBIOTIC CONSULT NOTE - follow up  Pharmacy Consult for Vancomycin & Cefepime Indication: pneumonia  No Known Allergies  Patient Measurements: Height: 5\' 2"  (157.5 cm) Weight: 205 lb 12.8 oz (93.35 kg) IBW/kg (Calculated) : 50.1  Vital Signs: Temp: 98.4 F (36.9 C) (09/22 0642) Temp src: Oral (09/22 0642) BP: 115/84 mmHg (09/22 0642) Pulse Rate: 101 (09/22 0642) Intake/Output from previous day: 09/21 0701 - 09/22 0700 In: 1230 [P.O.:480; IV Piggyback:750] Out: 800 [Urine:800] Intake/Output from this shift:    Labs:  Recent Labs  08/08/14 0416 08/09/14 0538 08/10/14 0548  WBC 8.9 6.6 7.9  HGB 10.0* 8.7* 8.9*  PLT 312 307 316  CREATININE 0.79 1.03 1.70*   Estimated Creatinine Clearance: 53.4 ml/min (by C-G formula based on Cr of 1.7).  Recent Labs  08/10/14 0839  [facility] 48.2*    Microbiology: No results found for this or any previous visit (from the past 720 hour(s)).  Medical History: Past Medical History  Diagnosis Date  . Diabetes mellitus without complication    Medications:  Scheduled:  . benzonatate  100 mg Oral TID  . carvedilol  3.125 mg Oral BID WC  . [START ON 08/11/2014] ceFEPime (MAXIPIME) IV  2 g Intravenous Q24H  . enoxaparin (LOVENOX) injection  40 mg Subcutaneous Q24H  . insulin aspart  0-15 Units Subcutaneous TID WC  . insulin aspart  0-5 Units Subcutaneous QHS  . insulin detemir  10 Units Subcutaneous QHS  . levalbuterol  0.63 mg Nebulization Q6H  . living well with diabetes book   Does not apply Once  . pantoprazole (PROTONIX) IV  40 mg Intravenous Q24H   Assessment: 28 yo F presented with cough, body aches, and nausea x1 week.  Chest CT +PNA.   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 She is afebrile with normal WBC.  No cx data obtained.  Renal function is worse, SCr now elevated.  Trough level is above goal.    Vancomycin 9/20>> Cefepime 9/20>> Zithromax 9/20>>  Goal of Therapy:   Vancomycin trough level 15-20 mcg/ml  Plan:  Change Cefepime to 2gm IV q24h (renal adjustment) D/C Vancomycin for now Check Vancomycin level tomorrow am to evaluate clearance F/U SCr tomorrow am Monitor renal function and cx data   Hart Robinsons A 08/10/2014,11:02 AM

## 2014-08-10 NOTE — Care Management Note (Signed)
    Page 1 of 1   08/19/2014     2:50:40 PM CARE MANAGEMENT NOTE 08/19/2014  Patient:  Diana [name], Diana [name]   Account Number:  [id number]  Date Initiated:  08/10/2014  Documentation initiated by:  Vladimir Creeks  Subjective/Objective Assessment:   Pt adm with HCAP, and now with  CHF.pt is from home, is independent and will return home at D/C     Action/Plan:   Will follow for needs-   Anticipated DC Date:  08/19/2014   Anticipated DC Plan:  [place]  CM consult      Choice offered to / List presented to:             Status of service:  Completed, signed off Medicare Important Message given?   (If response is "NO", the following Medicare IM given date fields will be blank) Date Medicare IM given:   Medicare IM given by:   Date Additional Medicare IM given:   Additional Medicare IM given by:    Discharge Disposition:  HOME/SELF CARE  Per UR Regulation:  Reviewed for med. necessity/level of care/duration of stay  If discussed at [place] of Stay Meetings, dates discussed:   08/17/2014    Comments:  08/19/14 1445 Vladimir Creeks RN/CM Pt improved and tolerating diet. D/C home today  with family 08/13/14 1430 [facility] RN/CM Pt still having problem with CHF and RF, both new onset, along with PNA. Being followed by nephrology, cardiology and hospitalist 08/10/14 1500 Vladimir Creeks RN/CM

## 2014-08-10 NOTE — Progress Notes (Addendum)
Inpatient Diabetes Program Recommendations  AACE/ADA: New Consensus Statement on Inpatient Glycemic Control (2013)  Target Ranges:  Prepandial:   less than 140 mg/dL      Peak postprandial:   less than 180 mg/dL (1-2 hours)      Critically ill patients:  140 - 180 mg/dL   Results for Diana [name], Diana [name] (MRN [id number]) as of 08/10/2014 10:37  Ref. Range 08/09/2014 07:38 08/09/2014 11:47 08/09/2014 16:45 08/09/2014 21:18 08/10/2014 07:46  Glucose-Capillary Latest Range: 70-99 mg/dL 237 (H) 206 (H) 151 (H) 205 (H) 203 (H)   Diabetes history: DM2 Outpatient Diabetes medications: Metformin 500 mg daily Current orders for Inpatient glycemic control: Levemir 10 units QHS, Novolog 0-15 units AC, Novolog 0-5 units HS  Inpatient Diabetes Program Recommendations Insulin - Basal: Please consider increasing Levemir to 14 units QHS (based on 93kg x 0.15 units). HgbA1C: A1C 9.4% on 08/08/14. MD, please indicate dishcarge plan for diabetes control (such as will pt discharge on insulin?).  08/10/14@14 :26- Spoke with patient about diabetes and home regimen for diabetes control. Patient reports that she has had diabetes since 2005 and that she was initially followed by [facility] for her diabetes but over the past couple of years she has went from seeing [facility] in [place] to the [facility] back to [place]. Currently she is followed by [facility] for diabetes management and currently she takes Metformin 500 mg daily as an outpatient for diabetes control. According to the patient she has been on Levemir and Novolog insulin in the past (prescribed by [facility]) but it has been several years since she was on insulin. Inquired about monitoring glucose and patient states that she does not check her glucose very often (a few times a month) but when she does it is always over 200 mg/dl.  Inquired about knowledge about A1C and patient reports that she does not know what an A1C is. Discussed A1C results (9.4% on 08/08/14) and  explained what an A1C is, basic pathophysiology of DM Type 2, basic home care, importance of checking CBGs and maintaining good CBG control to prevent long-term and short-term complications. Discussed impact of nutrition, exercise, stress, sickness, and medications on diabetes control.  Patient states that she does not really follow a diabetic diet and she reports drinking a lot of sugary drinks. Discussed carbohydrates, carbohydrate goals per day and meal, along with portion sizes. Will order RD consult for further diet education.  Discussed the possibility of discharging on insulin and patient is agreeable to using insulin and states that she prefers to use insulin pens. Reviewed normal glucose, hyperglycemia, and hypoglycemia along with treatment for both. Patient states that she has experienced low blood sugar in the past when she was on insulin and was able to feel when her glucose was less than 60 mg/dl (reports she would have sweating and shaking/tremors when she was low). Asked that if patient is discharged on insulin to check her glucose 3-4 times per day and keep a log of her glucose so that her doctor at [facility] could make adjustments if necessary.  Patient verbalized understanding of information discussed and she states that she has no further questions at this time related to diabetes.   Thanks, Barnie Alderman, RN, MSN, CCRN Diabetes Coordinator Inpatient Diabetes Program [phone] (Team Pager) [phone] ([place] office) [phone] [facility] office)    Thanks, Barnie Alderman, RN, MSN, [place] Diabetes Coordinator Inpatient Diabetes Program [phone] (Team Pager) [phone] ([place] office) [phone] [facility] office)

## 2014-08-10 NOTE — Progress Notes (Signed)
[facility] PROGRESS NOTE  Diana [name] [id number] DOB: Jul 02, 1986 DOA: 08/08/2014 PCP: Pcp Not In System  Assessment/Plan: 1. Healthcare associated multilobar pneumonia. Improved this am.  Oxygen saturation level Range 94-100% on room air. She remains afebrile with no leukocytosis. Continue cefepime day #3. Discontinue vancomycin due to acute kidney injury. Oral Zithromax discontinued 08/09/14 due to nausea/vomting. Continue Tessalon Perles, oxygen, and Xopenex nebulizer. await strep pneumo antigen and Legionella antigen; blood cultures if she runs a fever of greater than 101. 2. Bilateral pleural effusions. These could be parapneumonic or transudative.  3. Small pericardial effusion per CT and confirmed with echo. ECG with sinus tach at rate of 101. Evaluated by Dr Harl Bowie with cardiology who recommends lasix and coreg.  4. Type 2 diabetes mellitus. Uncontrolled.  The patient was diagnosed with diabetes mellitus in 2005 and takes metformin at home. HgA1c 9.4. Continue to hold this. CBG range 147-203. Continue Levemir and continue SSI. Will monitor and determine discharge plan for DM when close to discharge, oral intake more reliable. Will clearly need close OP follow up for improved control.  5. Normocytic anemia. Stable. Iron 35, TIBC 207 RBC 3.06 otherwise anemia panel within the limits of normal. Consider iron supplement.   6. Acute systolic HF: elevated BNP echo with EF 20-25%. May be related to #1 and #2. Volume status +430 for today. Weight up but appears better on exam. Evaluated by Dr Harl Bowie who recommends continue lasix, discontinue lisinopril due to AKI and start coreg. He opines low likelihood of ischemic cardiomyopathy and therefore no plans for ischemic testing at this time.   7. Nausea/vomiting. Likely related to #1. No coffee ground emesis.Lipase within the limits of normal. Abdominal xray without obstruction or ilieus. Improved today. Requesting advancement of diet.  Continue zofran as needed and pepcid.  8. Sinus tachycardia: likely related to #1 and #6. Improved today. No events on tele. monitor    Code Status: full Family Communication: mother at bedside Disposition Plan: home when medically readh   Consultants:  Dr branch cardiology  Procedures: 2 decho [id number]  Systolic function was severely reduced. The estimated ejection fraction was in the range of 20% to 25%. - Mitral valve: There was mild regurgitation.  Antibiotics: Vancomycin 08/08/14>> 08/10/14 Cefepime 08/08/14>>  Zithromax 08/08/14-08/09/14  PI/Subjective: Sitting on side of bed, smiling. Reports feeling better today. Does report one episode of nausea this am but requesting advancement of diet.   Objective: Filed Vitals:   08/10/14 1152  BP: 115/81  Pulse: 101  Temp:   Resp:     Intake/Output Summary (Last 24 hours) at 08/10/14 1334 Last data filed at 08/10/14 0615  Gross per 24 hour  Intake    870 ml  Output    800 ml  Net     70 ml   Filed Weights   08/08/14 0928 08/09/14 0623 08/10/14 0642  Weight: 81.194 kg (179 lb) 92.352 kg (203 lb 9.6 oz) 93.35 kg (205 lb 12.8 oz)    Exam:   General:  Appears much less ill.   Cardiovascular: tachycardia but regular. No MGR No LE edema PPP  Respiratory: normal effort BS clear bilaterally to auscultation. No crackles or wheezes  Abdomen: obese soft +BS non-tender to palpation  Musculoskeletal: no clubbing or cyanosis   Data Reviewed: Basic Metabolic Panel:  Recent Labs Lab 08/08/14 0416 08/09/14 0538 08/10/14 0548  NA 137 138 138  K 4.1 4.0 3.6*  CL 99 102 102  CO2 25 26 26   GLUCOSE  205* 191* 195*  BUN 8 8 11   CREATININE 0.79 1.03 1.70*  CALCIUM 8.9 8.5 8.6   Liver Function Tests:  Recent Labs Lab 08/09/14 0538 08/10/14 0839  AST 12 12  ALT 9 7  ALKPHOS 71 65  BILITOT 0.3 0.2*  PROT 6.4 6.1  ALBUMIN 2.4* 2.3*    Recent Labs Lab 08/09/14 1551  LIPASE 42   No results found for this  basename: AMMONIA,  in the last 168 hours CBC:  Recent Labs Lab 08/08/14 0416 08/09/14 0538 08/10/14 0548  WBC 8.9 6.6 7.9  NEUTROABS 6.9  --   --   HGB 10.0* 8.7* 8.9*  HCT 28.3* 25.5* 26.0*  MCV 84.5 85.0 85.2  PLT 312 307 316   Cardiac Enzymes:  Recent Labs Lab 08/08/14 0456 08/08/14 0857 08/09/14 1551  TROPONINI <0.30 <0.30 <0.30   BNP (last 3 results)  Recent Labs  08/08/14 0456  PROBNP 2629.0*   CBG:  Recent Labs Lab 08/09/14 1147 08/09/14 1645 08/09/14 2118 08/10/14 0746 08/10/14 1157  GLUCAP 206* 151* 205* 203* 147*    No results found for this or any previous visit (from the past 240 hour(s)).   Studies: Dg Abd 2 Views  08/09/2014   CLINICAL DATA:  Upper abdominal pain.  EXAM: ABDOMEN - 2 VIEW  COMPARISON:  None.  FINDINGS: The bowel gas pattern is normal. There is no evidence of free air. No radio-opaque calculi or other significant radiographic abnormality is seen. Mild amount of stool burden is noted in the right colon.  IMPRESSION: No evidence of bowel obstruction or ileus is noted.   Electronically Signed   By: Sabino Dick M.D.   On: 08/09/2014 16:26    Scheduled Meds: . benzonatate  100 mg Oral TID  . carvedilol  3.125 mg Oral BID WC  . [START ON 08/11/2014] ceFEPime (MAXIPIME) IV  2 g Intravenous Q24H  . enoxaparin (LOVENOX) injection  40 mg Subcutaneous Q24H  . insulin aspart  0-15 Units Subcutaneous TID WC  . insulin aspart  0-5 Units Subcutaneous QHS  . insulin detemir  14 Units Subcutaneous QHS  . levalbuterol  0.63 mg Nebulization Q6H  . living well with diabetes book   Does not apply Once  . pantoprazole (PROTONIX) IV  40 mg Intravenous Q24H  . potassium chloride  40 mEq Oral Q4H   Continuous Infusions:   Principal Problem:   HCAP (healthcare-associated pneumonia) Active Problems:   Pleural effusion, bilateral   Pericardial effusion   Type II or unspecified type diabetes mellitus without mention of complication, not stated as  uncontrolled   Anemia, unspecified   Nausea with vomiting   Acute systolic heart failure   Acute renal failure   Sinus tachycardia    Time spent: 35 minutes    [facility] Pager [phone]. If 7PM-7AM, please contact night-coverage at [email or web address], password [facility] 08/10/2014, 1:34 PM  LOS: 2 days

## 2014-08-10 NOTE — Plan of Care (Signed)
Problem: Inadequate Intake (NI-2.1) Goal: Food and/or nutrient delivery Individualized approach for food/nutrient provision. Outcome: Adequate for Discharge  RD consulted for nutrition education regarding diabetes.     Lab Results  Component Value Date    HGBA1C 9.4* 08/08/2014   Pt reports she has had diabetes since 2005 and has always struggled with control. She reports being adherent to her diet is the biggest barrier to optimizing glycemic control. She admits to drinking a lot of sugary beverages.   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 She has very limited knowledge of a diabetic diet. Pt eats 3-4 meals per day. Diet call as as follows: 1315: roast beef, string beans, mashed potatoes, and Kool Aid from cafeteria; 1630: 6" steak and cheese sub sandwich with salad and chips from [employer]; 1830: meat, starch, vegetable, and sweet tea from cafeteria; 2200: pack of Ramen Noodles.   RD provided "Carbohydrate Counting for People with Diabetes" handout from the Academy of [employer]etics. Discussed different food groups and their effects on blood sugar, emphasizing carbohydrate-containing foods. Provided list of carbohydrates and recommended serving sizes of common foods.  Discussed importance of controlled and consistent carbohydrate intake throughout the day. Provided examples of ways to balance meals/snacks and encouraged intake of high-fiber, whole grain complex carbohydrates. Teach back method used.  Pt was nauseated and time of visit. Reviewed basic components of diabetic diet with pt and mother. Handout was provided and encouraged both of them to review. Pt would greatly benefit from outpatient diabetes education after discharge.   Expect fair compliance.  Body mass index is 37.63 kg/(m^2). Pt meets criteria for obesity, class II based on current BMI.  Current diet order is Carb modified, patient is consuming approximately 0-75% of meals at this time. Labs and medications  reviewed. No further nutrition interventions warranted at this time. RD contact information provided. If additional nutrition issues arise, please re-consult RD.  Zaela Graley A. Jimmye Norman, RD, LDN Pager: [phone]

## 2014-08-11 ENCOUNTER — Encounter (HOSPITAL_COMMUNITY): Payer: Self-pay | Admitting: Internal Medicine

## 2014-08-11 ENCOUNTER — Inpatient Hospital Stay (HOSPITAL_COMMUNITY): Payer: BC Managed Care – PPO

## 2014-08-11 DIAGNOSIS — E669 Obesity, unspecified: Secondary | ICD-10-CM | POA: Diagnosis present

## 2014-08-11 DIAGNOSIS — N179 Acute kidney failure, unspecified: Secondary | ICD-10-CM

## 2014-08-11 DIAGNOSIS — R112 Nausea with vomiting, unspecified: Secondary | ICD-10-CM

## 2014-08-11 LAB — BASIC METABOLIC PANEL
Anion gap: 12 (ref 5–15)
BUN: 11 mg/dL (ref 6–23)
CO2: 26 mEq/L (ref 19–32)
Calcium: 8.7 mg/dL (ref 8.4–10.5)
Chloride: 101 mEq/L (ref 96–112)
Creatinine, Ser: 2.23 mg/dL — ABNORMAL HIGH (ref 0.50–1.10)
GFR calc Af Amer: 34 mL/min — ABNORMAL LOW (ref >90)
GFR calc non Af Amer: 29 mL/min — ABNORMAL LOW (ref >90)
Glucose, Bld: 133 mg/dL — ABNORMAL HIGH (ref 70–99)
Potassium: 3.8 mEq/L (ref 3.7–5.3)
Sodium: 139 mEq/L (ref 137–147)

## 2014-08-11 LAB — URINALYSIS, ROUTINE W REFLEX MICROSCOPIC
Bilirubin Urine: NEGATIVE
Glucose, UA: NEGATIVE mg/dL
Ketones, ur: NEGATIVE mg/dL
Nitrite: NEGATIVE
Protein, ur: 100 mg/dL — AB
Specific Gravity, Urine: 1.025 (ref 1.005–1.030)
Urobilinogen, UA: 0.2 mg/dL (ref 0.0–1.0)
pH: 5.5 (ref 5.0–8.0)

## 2014-08-11 LAB — GLUCOSE, CAPILLARY
Glucose-Capillary: 134 mg/dL — ABNORMAL HIGH (ref 70–99)
Glucose-Capillary: 128 mg/dL — ABNORMAL HIGH (ref 70–99)
Glucose-Capillary: 136 mg/dL — ABNORMAL HIGH (ref 70–99)
Glucose-Capillary: 109 mg/dL — ABNORMAL HIGH (ref 70–99)

## 2014-08-11 LAB — LEGIONELLA ANTIGEN, URINE: Legionella Antigen, Urine: NEGATIVE

## 2014-08-11 LAB — STREP PNEUMONIAE URINARY ANTIGEN: Strep Pneumo Urinary Antigen: NEGATIVE

## 2014-08-11 LAB — CBC
HCT: 25.4 % — ABNORMAL LOW (ref 36.0–46.0)
Hemoglobin: 8.6 g/dL — ABNORMAL LOW (ref 12.0–15.0)
MCH: 28.8 pg (ref 26.0–34.0)
MCHC: 33.9 g/dL (ref 30.0–36.0)
MCV: 84.9 fL (ref 78.0–100.0)
Platelets: 319 10*3/uL (ref 150–400)
RBC: 2.99 MIL/uL — ABNORMAL LOW (ref 3.87–5.11)
RDW: 11.7 % (ref 11.5–15.5)
WBC: 8.2 10*3/uL (ref 4.0–10.5)

## 2014-08-11 LAB — VITAMIN B12: Vitamin B-12: 329 pg/mL (ref 211–911)

## 2014-08-11 LAB — FERRITIN: Ferritin: 190 ng/mL (ref 10–291)

## 2014-08-11 LAB — URINE MICROSCOPIC-ADD ON

## 2014-08-11 LAB — HEPATITIS PANEL, ACUTE
HCV Ab: NEGATIVE
Hep A IgM: NONREACTIVE
Hep B C IgM: NONREACTIVE
Hepatitis B Surface Ag: NEGATIVE

## 2014-08-11 LAB — FOLATE: Folate: 15.6 ng/mL

## 2014-08-11 LAB — VANCOMYCIN, RANDOM: Vancomycin Rm: 27.3 ug/mL

## 2014-08-11 LAB — SEDIMENTATION RATE: Sed Rate: 105 mm/hr — ABNORMAL HIGH (ref 0–22)

## 2014-08-11 LAB — IRON AND TIBC
Iron: 45 ug/dL (ref 42–135)
Saturation Ratios: 20 % (ref 20–55)
TIBC: 223 ug/dL — ABNORMAL LOW (ref 250–470)
UIBC: 178 ug/dL (ref 125–400)

## 2014-08-11 LAB — RETICULOCYTES
RBC.: 3.19 MIL/uL — ABNORMAL LOW (ref 3.87–5.11)
Retic Count, Absolute: 89.3 10*3/uL (ref 19.0–186.0)
Retic Ct Pct: 2.8 % (ref 0.4–3.1)

## 2014-08-11 MED ORDER — LEVALBUTEROL HCL 0.63 MG/3ML IN NEBU
0.6300 mg | INHALATION_SOLUTION | Freq: Three times a day (TID) | RESPIRATORY_TRACT | Status: DC
  Administered 2014-08-11 – 2014-08-12 (×3): 0.63 mg via RESPIRATORY_TRACT
  Filled 2014-08-11 (×5): qty 3

## 2014-08-11 MED ORDER — LEVOFLOXACIN IN D5W 750 MG/150ML IV SOLN
750.0000 mg | INTRAVENOUS | Status: DC
  Administered 2014-08-11: 750 mg via INTRAVENOUS
  Filled 2014-08-11: qty 150

## 2014-08-11 NOTE — Progress Notes (Signed)
[facility] PROGRESS NOTE  Diana [name] [id number] DOB: 10-07-1986 DOA: 08/08/2014 PCP: Pcp Not In System  Assessment/Plan: 1. Healthcare associated multilobar pneumonia. Improved this am. Oxygen saturation level 100% on room air. She remains afebrile with no leukocytosis. Will transition antibiotics to po.  Continue Tessalon Perles, oxygen, and Xopenex nebulizer. Strep pneumo antigen and Legionella antigen both negative; blood cultures if she runs a fever of greater than 101. 2. Bilateral pleural effusions. These could be parapneumonic or transudative.  3. Small pericardial effusion per CT and confirmed with echo. ECG with sinus tach at rate of 101. Evaluated by Dr Harl Bowie with cardiology who recommended lasix and coreg. Holding lasix today due to worsening renal failure 4. Type 2 diabetes mellitus. Uncontrolled. The patient was diagnosed with diabetes mellitus in 2005 and takes metformin at home. HgA1c 9.4. Continue to hold this. CBG range 128-134. Continue Levemir and continue SSI. Will monitor and determine discharge plan for DM when close to discharge, oral intake more reliable. Will clearly need close OP follow up for improved control.  5. Normocytic anemia. Stable. Iron 35, TIBC 207 RBC 3.06 otherwise anemia panel within the limits of normal. Consider iron supplement.  6. Acute systolic HF: elevated BNP echo with EF 20-25%. May be related to #1 and #2. Volume status +1.1L for today. Weight up but appears better on exam. Evaluated by Dr Harl Bowie who recommends holding lasix today and continuing coreg. He opines low likelihood of ischemic cardiomyopathy and therefore no plans for ischemic testing at this time.  7. Nausea/vomiting. Likely related to #1. No coffee ground emesis.Lipase within the limits of normal. Abdominal xray without obstruction or ilieus. Improved today. Requesting advancement of diet. Continue zofran as needed and pepcid.  8. Sinus tachycardia: likely related to #1  and #6. Improved today. No events on tele. Monitor\ 9. Acute kidney injury: worsening today.  etiology uncertain or likely multifactorial i.e. Vancomycin induced, contrast dye, diuresis. Request nephrology consult.     Code Status: full Family Communication: none Disposition Plan: home when ready   Consultants:  Cardiology  nephrology  Procedures: 2 decho [id number] Systolic function was severely reduced. The estimated ejection fraction was in the range of 20% to 25%. - Mitral valve: There was mild regurgitation.  Antibiotics: Vancomycin 08/08/14>> 08/10/14  Cefepime 08/08/14>> 08/11/14 Zithromax 08/08/14-08/09/14 levaquin 08/11/14>>   HPI/Subjective: Awake but lethargic. Continues with nausea and vomiting  Objective: Filed Vitals:   08/11/14 0540  BP: 112/76  Pulse: 100  Temp: 98.6 F (37 C)  Resp: 20    Intake/Output Summary (Last 24 hours) at 08/11/14 1422 Last data filed at 08/11/14 1339  Gross per 24 hour  Intake    120 ml  Output    500 ml  Net   -380 ml   Filed Weights   08/09/14 0623 08/10/14 0642 08/11/14 0540  Weight: 92.352 kg (203 lb 9.6 oz) 93.35 kg (205 lb 12.8 oz) 91.989 kg (202 lb 12.8 oz)    Exam:   General:  Obese appears comfortable  Cardiovascular: RRR No MGR No LE edema  Respiratory: normal effort BS distant but clear no wheeze  Abdomen: obese soft +BS non-tender  Musculoskeletal: no clubbing or cyanosis   Data Reviewed: Basic Metabolic Panel:  Recent Labs Lab 08/08/14 0416 08/09/14 0538 08/10/14 0548 08/11/14 0630  NA 137 138 138 139  K 4.1 4.0 3.6* 3.8  CL 99 102 102 101  CO2 25 26 26 26   GLUCOSE 205* 191* 195* 133*  BUN 8 8  11 11  CREATININE 0.79 1.03 1.70* 2.23*  CALCIUM 8.9 8.5 8.6 8.7   Liver Function Tests:  Recent Labs Lab 08/09/14 0538 08/10/14 0839  AST 12 12  ALT 9 7  ALKPHOS 71 65  BILITOT 0.3 0.2*  PROT 6.4 6.1  ALBUMIN 2.4* 2.3*    Recent Labs Lab 08/09/14 1551  LIPASE 42   No results found  for this basename: AMMONIA,  in the last 168 hours CBC:  Recent Labs Lab 08/08/14 0416 08/09/14 0538 08/10/14 0548 08/11/14 0630  WBC 8.9 6.6 7.9 8.2  NEUTROABS 6.9  --   --   --   HGB 10.0* 8.7* 8.9* 8.6*  HCT 28.3* 25.5* 26.0* 25.4*  MCV 84.5 85.0 85.2 84.9  PLT 312 307 316 319   Cardiac Enzymes:  Recent Labs Lab 08/08/14 0456 08/08/14 0857 08/09/14 1551  TROPONINI <0.30 <0.30 <0.30   BNP (last 3 results)  Recent Labs  08/08/14 0456  PROBNP 2629.0*   CBG:  Recent Labs Lab 08/10/14 1350 08/10/14 1624 08/10/14 2209 08/11/14 0734 08/11/14 1126  GLUCAP 171* 148* 107* 134* 128*    No results found for this or any previous visit (from the past 240 hour(s)).   Studies: US Renal  08/11/2014   CLINICAL DATA:  Acute kidney injury and renal failure.  EXAM: RENAL/URINARY TRACT ULTRASOUND COMPLETE  COMPARISON:  None.  FINDINGS: Right Kidney:  Length: 13.0. Echogenicity within normal limits. No mass or hydronephrosis visualized.  Left Kidney:  Length: 11.5. Echogenicity within normal limits. No mass or hydronephrosis visualized.  Bladder:  Appears normal for degree of bladder distention.  IMPRESSION: Normal renal ultrasound.   Electronically Signed   By: Aletta Edouard M.D.   On: 08/11/2014 11:00   Dg Abd 2 Views  08/09/2014   CLINICAL DATA:  Upper abdominal pain.  EXAM: ABDOMEN - 2 VIEW  COMPARISON:  None.  FINDINGS: The bowel gas pattern is normal. There is no evidence of free air. No radio-opaque calculi or other significant radiographic abnormality is seen. Mild amount of stool burden is noted in the right colon.  IMPRESSION: No evidence of bowel obstruction or ileus is noted.   Electronically Signed   By: Sabino Dick M.D.   On: 08/09/2014 16:26    Scheduled Meds: . benzonatate  100 mg Oral TID  . carvedilol  3.125 mg Oral BID WC  . ceFEPime (MAXIPIME) IV  2 g Intravenous Q24H  . enoxaparin (LOVENOX) injection  40 mg Subcutaneous Q24H  . insulin aspart  0-15 Units  Subcutaneous TID WC  . insulin aspart  0-5 Units Subcutaneous QHS  . insulin detemir  14 Units Subcutaneous QHS  . levalbuterol  0.63 mg Nebulization TID  . living well with diabetes book   Does not apply Once  . pantoprazole (PROTONIX) IV  40 mg Intravenous Q24H   Continuous Infusions:   Principal Problem:   HCAP (healthcare-associated pneumonia) Active Problems:   Pleural effusion, bilateral   Pericardial effusion   Type II or unspecified type diabetes mellitus without mention of complication, not stated as uncontrolled   Anemia, unspecified   Nausea with vomiting   Acute systolic heart failure   Acute renal failure   Sinus tachycardia   Obesity, unspecified    Time spent: 35 minutes    [facility] Pager [phone]. If 7PM-7AM, please contact night-coverage at [email or web address], password [facility] 08/11/2014, 2:22 PM  LOS: 3 days

## 2014-08-11 NOTE — Care Management Utilization Note (Signed)
UR completed 

## 2014-08-11 NOTE — Consult Note (Addendum)
Diana [name] MRN: [id number] DOB/AGE: May 20, 1986 28 y.o. Primary Care Physician:Pcp Not In System Admit date: 08/08/2014 Chief Complaint:  Chief Complaint  Patient presents with  . Cough   HPI: Pt is 28 year old female with past medical hx of DM who presented to ER with c/o cough.  HPI dates back to 1 week ago when pt started having Dyspnea and cough. Pt c/o dyspnea with activity.it was progressive in nature. Pt also c/o orthopnea Pt also c/o swelling on legs which she says is now better . Pt says her cough was productive with yellowish sputum. On evaluation in ER pt was admitted with Pneumonia and CHF. Pt was started on lasix and Lisinopril.     Past Medical History  Diagnosis Date  . Diabetes mellitus without complication   . HCAP (healthcare-associated pneumonia)   . Pleural effusion     bilateral  . Acute systolic heart failure   . Acute kidney injury   . Sinus tachycardia   . Obesity (BMI 30-39.9)     bmi 37.6       History reviewed. No pertinent family history. Positive hx of ESRD.pt Mother on HD.   Social History:  reports that she has never smoked. She does not have any smokeless tobacco history on file. She reports that she does not drink alcohol or use illicit drugs.  Allergies: No Known Allergies  Medications Prior to Admission  Medication Sig Dispense Refill  . acetaminophen (TYLENOL) 500 MG tablet Take 1,000 mg by mouth daily as needed for moderate pain.      Marland Kitchen albuterol (PROAIR HFA) 108 (90 BASE) MCG/ACT inhaler Inhale 2 puffs into the lungs daily as needed for wheezing or shortness of breath.       . metFORMIN (GLUCOPHAGE) 500 MG tablet Take 500 mg by mouth daily.      . naproxen sodium (ANAPROX) 220 MG tablet Take 220 mg by mouth 2 (two) times daily as needed (pain).           [id number] from the symptoms mentioned above,there are no other symptoms referable to all systems reviewed.  . benzonatate  100 mg Oral TID  . carvedilol  3.125 mg  Oral BID WC  . ceFEPime (MAXIPIME) IV  2 g Intravenous Q24H  . enoxaparin (LOVENOX) injection  40 mg Subcutaneous Q24H  . insulin aspart  0-15 Units Subcutaneous TID WC  . insulin aspart  0-5 Units Subcutaneous QHS  . insulin detemir  14 Units Subcutaneous QHS  . levalbuterol  0.63 mg Nebulization TID  . living well with diabetes book   Does not apply Once  . pantoprazole (PROTONIX) IV  40 mg Intravenous Q24H     Physical Exam: Vital signs in last 24 hours: Temp:  [97.9 F (36.6 C)-98.6 F (37 C)] 98.6 F (37 C) (09/23 0540) Pulse Rate:  [97-104] 100 (09/23 0540) Resp:  [16-20] 20 (09/23 0540) BP: (109-115)/(71-81) 112/76 mmHg (09/23 0540) SpO2:  [95 %-100 %] 100 % (09/23 0704) Weight:  [202 lb 12.8 oz (91.989 kg)] 202 lb 12.8 oz (91.989 kg) (09/23 0540) Weight change: -3 lb (-1.361 kg) Last BM Date: 08/09/14  Intake/Output from previous day: 09/22 0701 - 09/23 0700 In: 120 [P.O.:120] Out: 800 [Urine:800]     Physical Exam: General- pt is awake,alert, oriented to time place and person Resp- No acute REsp distress, CTA B/L NO Rhonchi CVS- S1S2 regular in rate and rhythm GIT- BS+, soft, NT, ND EXT- NO LE Edema,NO  Cyanosis CNS- CN  2-12 grossly intact. Moving all 4 extremities Psych- depressed mood and flat affect    Lab Results: CBC  Recent Labs  08/10/14 0548 08/11/14 0630  WBC 7.9 8.2  HGB 8.9* 8.6*  HCT 26.0* 25.4*  PLT 316 319    BMET  Recent Labs  08/10/14 0548 08/11/14 0630  NA 138 139  K 3.6* 3.8  CL 102 101  CO2 26 26  GLUCOSE 195* 133*  BUN 11 11  CREATININE 1.70* 2.23*  CALCIUM 8.6 8.7    Trend Creat 2015 0.79=>1.03=>1.7=>2.23   Lab Results  Component Value Date   CALCIUM 8.7 08/11/2014      Impression: 1)Renal  AKI secondary to Multiple factors                      Sepsis- admitted with Pneumonia                       NSAIDS- on Naproxen as oputpt                       Hypotension- Bp on lower side                        ACE- now dc.ed                       Cardiorenal Ef on lower side                       Vanco - trough was high                       IV dye                    ? 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                     Pt with hx of DM since past decade.                      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 Will ask for FENA Will ask for U/a Will ask for renal US Will discuss the need of biopsy after the results Will follow bmet Will initiate proteinuria work up Will suggest to follow vanco levels    BHUTANI,MANPREET S 08/11/2014, 8:53 AM

## 2014-08-11 NOTE — Progress Notes (Addendum)
Patient ID: Diana [name], female   DOB: 04-09-1986, 28 y.o.   MRN: [id number]    Subjective:    + Nausea and vomiting this morning  Objective:   Temp:  [97.9 F (36.6 C)-98.6 F (37 C)] 98.6 F (37 C) (09/23 0540) Pulse Rate:  [97-104] 100 (09/23 0540) Resp:  [16-20] 20 (09/23 0540) BP: (109-115)/(71-81) 112/76 mmHg (09/23 0540) SpO2:  [95 %-100 %] 100 % (09/23 0704) Weight:  [202 lb 12.8 oz (91.989 kg)] 202 lb 12.8 oz (91.989 kg) (09/23 0540) Last BM Date: 08/07/14  Filed Weights   08/09/14 0623 08/10/14 [id number] 08/11/14 0540  Weight: 203 lb 9.6 oz (92.352 kg) 205 lb 12.8 oz (93.35 kg) 202 lb 12.8 oz (91.989 kg)    Intake/Output Summary (Last 24 hours) at 08/11/14 0823 Last data filed at 08/10/14 1300  Gross per 24 hour  Intake    120 ml  Output    800 ml  Net   -680 ml    Telemetry: sinus rhythm  Exam:  General: NAD  Resp: CTAB  Cardiac: RRR, no m/r/g, no JVD, no carotid bruits  GI: abdomen soft, NT, ND  MSK: no LE edema  Neuro: no focal deficits  Psych: appropriate affect  Lab Results:  Basic Metabolic Panel:  Recent Labs Lab 08/09/14 0538 08/10/14 0548 08/11/14 0630  NA 138 138 139  K 4.0 3.6* 3.8  CL 102 102 101  CO2 26 26 26   GLUCOSE 191* 195* 133*  BUN 8 11 11   CREATININE 1.03 1.70* 2.23*  CALCIUM 8.5 8.6 8.7    Liver Function Tests:  Recent Labs Lab 08/09/14 0538 08/10/14 0839  AST 12 12  ALT 9 7  ALKPHOS 71 65  BILITOT 0.3 0.2*  PROT 6.4 6.1  ALBUMIN 2.4* 2.3*    CBC:  Recent Labs Lab 08/09/14 0538 08/10/14 0548 08/11/14 0630  WBC 6.6 7.9 8.2  HGB 8.7* 8.9* 8.6*  HCT 25.5* 26.0* 25.4*  MCV 85.0 85.2 84.9  PLT 307 316 319    Cardiac Enzymes:  Recent Labs Lab 08/08/14 0456 08/08/14 0857 08/09/14 1551  TROPONINI <0.30 <0.30 <0.30    BNP:  Recent Labs  08/08/14 0456  PROBNP 2629.0*    Coagulation: No results found for this basename: INR,  in the last 168 hours  ECG:   Medications:     Scheduled Medications: . benzonatate  100 mg Oral TID  . carvedilol  3.125 mg Oral BID WC  . ceFEPime (MAXIPIME) IV  2 g Intravenous Q24H  . enoxaparin (LOVENOX) injection  40 mg Subcutaneous Q24H  . insulin aspart  0-15 Units Subcutaneous TID WC  . insulin aspart  0-5 Units Subcutaneous QHS  . insulin detemir  14 Units Subcutaneous QHS  . levalbuterol  0.63 mg Nebulization TID  . living well with diabetes book   Does not apply Once  . pantoprazole (PROTONIX) IV  40 mg Intravenous Q24H     Infusions:     PRN Medications:  acetaminophen, acetaminophen, alum & mag hydroxide-simeth, guaiFENesin-dextromethorphan, HYDROcodone-acetaminophen, morphine injection, ondansetron (ZOFRAN) IV, ondansetron, promethazine     Assessment/Plan    1. Acute systolic heart failure  - echo LVEF 20-25%, global hypokinesis, small pericardial effusion  - TSH 1.14, EKG non-ischemic, negative troponins  - chest imaging with evidence of multilobar pneumonia, this is likely a viral induced respiratory infection with viral induced CM, vs bacterial or atypical pneumonia with stress induced CM  - Cr trending up, her lasix and lisinopril have  been held - started coreg 3.125mg  bid, appears to be tolerating well. Likely increase to 6.25mg  bid before discharge with further titration as outpatient. - fairly low likelihood of ischemic CM, no plans for ischemic testing at this time   2. AKI  - unclear etiology, unclear if related to diuresis (she is reported net positive however 2nd shift uop not documented) or if related to infection or Abx or contrast induced. With her nausea and vomiting she may very well by dry - consider urine studies with FeUrea. If prerenal consider gentle IVF, from heart failure standpoint she does not currently appear volume overloaded and IVF would be ok - will stop ACE-I, hold diuretics today        Carlyle Dolly, M.D., F.A.C.C.

## 2014-08-11 NOTE — Progress Notes (Signed)
ANTIBIOTIC CONSULT NOTE - follow up  Pharmacy Consult for Levaquin Indication: pneumonia  No Known Allergies  Patient Measurements: Height: 5\' 2"  (157.5 cm) Weight: 202 lb 12.8 oz (91.989 kg) IBW/kg (Calculated) : 50.1  Vital Signs: Temp: 98.6 F (37 C) (09/23 0540) Temp src: Oral (09/23 0540) BP: 112/76 mmHg (09/23 0540) Pulse Rate: 100 (09/23 0540) Intake/Output from previous day: 09/22 0701 - 09/23 0700 In: 120 [P.O.:120] Out: 1000 [Urine:1000] Intake/Output from this shift: Total I/O In: 120 [P.O.:120] Out: 300 [Urine:300]  Labs:  Recent Labs  08/09/14 0538 08/10/14 0548 08/11/14 0630  WBC 6.6 7.9 8.2  HGB 8.7* 8.9* 8.6*  PLT 307 316 319  CREATININE 1.03 1.70* 2.23*   Estimated Creatinine Clearance: 40.4 ml/min (by C-G formula based on Cr of 2.23).  Recent Labs  08/10/14 0839 08/11/14 0630  VANCOTROUGH 48.2*  --   VANCORANDOM  --  27.3    Microbiology: No results found for this or any previous visit (from the past 720 hour(s)).  Medical History: Past Medical History  Diagnosis Date  . Diabetes mellitus without complication   . HCAP (healthcare-associated pneumonia)   . Pleural effusion     bilateral  . Acute systolic heart failure   . Acute kidney injury   . Sinus tachycardia   . Obesity (BMI 30-39.9)     bmi 37.6   Medications:  Scheduled:  . benzonatate  100 mg Oral TID  . carvedilol  3.125 mg Oral BID WC  . enoxaparin (LOVENOX) injection  40 mg Subcutaneous Q24H  . insulin aspart  0-15 Units Subcutaneous TID WC  . insulin aspart  0-5 Units Subcutaneous QHS  . insulin detemir  14 Units Subcutaneous QHS  . levalbuterol  0.63 mg Nebulization TID  . levofloxacin (LEVAQUIN) IV  750 mg Intravenous Q48H  . living well with diabetes book   Does not apply Once  . pantoprazole (PROTONIX) IV  40 mg Intravenous Q24H   Assessment: 28 yo F presented with cough, body aches, and nausea x1 week.  Chest CT +PNA.   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  Renal function is worse, SCr now elevated.  Trough level is above goal.  Vancomycin and Cefepime d/c'd in favor of Levaquin  Vancomycin 9/20>>9/22 Cefepime 9/20>>9/23 Zithromax 9/20 x 1 dose Levaquin 9/23 >>  Goal of Therapy:  Eradicate infection.  Plan:  Levaquin 750mg  IV q48hrs (renally adjusted) Switch to PO when improved / appropriate Monitor renal function and cx data   Hart Robinsons A 08/11/2014,2:58 PM

## 2014-08-11 NOTE — Progress Notes (Signed)
Patient seen and examined. 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 She is followed by cardiology and was given Lasix with improvement in her shortness of breath. Currently is on hold due to the elevation of creatinine. Results felt to have possible healthcare associated pneumonia. She's currently on antibiotics. With her worsening renal function, nephrology consultation was requested. It was felt that her renal failure is likely multifactorial including vancomycin related, contrast dye, diuresis, hypertension. He also felt that an immunologic workup may be helpful and started the patient on a 24-hour urine collection. We'll continue to monitor to ensure that renal function are stable. She has nausea and vomiting. Abdominal x-rays unremarkable. Continue as needed Zofran. She does improvement of her symptoms with antiemetics.  MEMON,JEHANZEB

## 2014-08-12 ENCOUNTER — Inpatient Hospital Stay (HOSPITAL_COMMUNITY): Payer: BC Managed Care – PPO

## 2014-08-12 DIAGNOSIS — A599 Trichomoniasis, unspecified: Secondary | ICD-10-CM | POA: Diagnosis present

## 2014-08-12 LAB — GLOMERULAR BASEMENT MEMBRANE ANTIBODIES: GBM Ab: <1

## 2014-08-12 LAB — ANCA SCREEN W REFLEX TITER
Atypical p-ANCA Screen: NEGATIVE
c-ANCA Screen: NEGATIVE
p-ANCA Screen: NEGATIVE

## 2014-08-12 LAB — BASIC METABOLIC PANEL
Anion gap: 11 (ref 5–15)
BUN: 13 mg/dL (ref 6–23)
CO2: 25 mEq/L (ref 19–32)
Calcium: 8.8 mg/dL (ref 8.4–10.5)
Chloride: 103 mEq/L (ref 96–112)
Creatinine, Ser: 2.23 mg/dL — ABNORMAL HIGH (ref 0.50–1.10)
GFR calc Af Amer: 34 mL/min — ABNORMAL LOW (ref >90)
GFR calc non Af Amer: 29 mL/min — ABNORMAL LOW (ref >90)
Glucose, Bld: 110 mg/dL — ABNORMAL HIGH (ref 70–99)
Potassium: 4 mEq/L (ref 3.7–5.3)
Sodium: 139 mEq/L (ref 137–147)

## 2014-08-12 LAB — GLUCOSE, CAPILLARY
Glucose-Capillary: 122 mg/dL — ABNORMAL HIGH (ref 70–99)
Glucose-Capillary: 117 mg/dL — ABNORMAL HIGH (ref 70–99)
Glucose-Capillary: 110 mg/dL — ABNORMAL HIGH (ref 70–99)
Glucose-Capillary: 104 mg/dL — ABNORMAL HIGH (ref 70–99)

## 2014-08-12 LAB — ANA: Anti Nuclear Antibody(ANA): NEGATIVE

## 2014-08-12 LAB — MPO/PR-3 (ANCA) ANTIBODIES
Myeloperoxidase Abs: <1
Serine Protease 3: <1

## 2014-08-12 LAB — C3 COMPLEMENT: C3 Complement: 128 mg/dL (ref 90–180)

## 2014-08-12 LAB — ANTI-DNA ANTIBODY, DOUBLE-STRANDED: ds DNA Ab: <1 IU/mL

## 2014-08-12 LAB — C4 COMPLEMENT: Complement C4, Body Fluid: 44 mg/dL — ABNORMAL HIGH (ref 10–40)

## 2014-08-12 MED ORDER — CARVEDILOL 3.125 MG PO TABS
6.2500 mg | ORAL_TABLET | Freq: Two times a day (BID) | ORAL | Status: DC
  Administered 2014-08-12 – 2014-08-19 (×15): 6.25 mg via ORAL
  Filled 2014-08-12 (×15): qty 2

## 2014-08-12 MED ORDER — SODIUM CHLORIDE 0.9 % IV SOLN
INTRAVENOUS | Status: DC
  Administered 2014-08-12 – 2014-08-13 (×4): via INTRAVENOUS

## 2014-08-12 MED ORDER — METRONIDAZOLE IVPB CUSTOM
2.0000 g | Freq: Once | INTRAVENOUS | Status: AC
  Administered 2014-08-12: 2 g via INTRAVENOUS
  Filled 2014-08-12: qty 400

## 2014-08-12 MED ORDER — CARVEDILOL 3.125 MG PO TABS: 6.2500 mg | ORAL_TABLET | Freq: Two times a day (BID) | ORAL | Status: DC

## 2014-08-12 MED ORDER — METOCLOPRAMIDE HCL 5 MG/ML IJ SOLN
10.0000 mg | Freq: Four times a day (QID) | INTRAMUSCULAR | Status: DC
  Administered 2014-08-12 – 2014-08-17 (×20): 10 mg via INTRAVENOUS
  Filled 2014-08-12 (×20): qty 2

## 2014-08-12 NOTE — Progress Notes (Signed)
Subjective: Interval History: has complaints nausea and abdominal pain. Presently patient does not have any vomiting. Patient also denies any diarrhea. Abdominal pain is more or less the same and generalized  Objective: Vital signs in last 24 hours: Temp:  [98.2 F (36.8 C)-98.8 F (37.1 C)] 98.3 F (36.8 C) (09/24 0521) Pulse Rate:  [98-101] 101 (09/24 0521) Resp:  [18-20] 20 (09/24 0521) BP: (115-127)/(78-85) 127/85 mmHg (09/24 0521) SpO2:  [96 %-100 %] 100 % (09/24 0731) Weight:  [90.8 kg (200 lb 2.8 oz)] 90.8 kg (200 lb 2.8 oz) (09/24 0521) Weight change: -1.19 kg (-2 lb 10 oz)  Intake/Output from previous day: 09/23 0701 - 09/24 0700 In: 270 [P.O.:120; IV Piggyback:150] Out: 950 [Urine:950] Intake/Output this shift:    General appearance: alert, cooperative and no distress Resp: clear to auscultation bilaterally Cardio: regular rate and rhythm, S1, S2 normal, no murmur, click, rub or gallop GI: soft, non-tender; bowel sounds normal; no masses,  no organomegaly Extremities: extremities normal, atraumatic, no cyanosis or edema  Lab Results:  Recent Labs  08/10/14 0548 08/11/14 0630  WBC 7.9 8.2  HGB 8.9* 8.6*  HCT 26.0* 25.4*  PLT 316 319   BMET:  Recent Labs  08/11/14 0630 08/12/14 0616  NA 139 139  K 3.8 4.0  CL 101 103  CO2 26 25  GLUCOSE 133* 110*  BUN 11 13  CREATININE 2.23* 2.23*  CALCIUM 8.7 8.8   No results found for this basename: PTH,  in the last 72 hours Iron Studies:  Recent Labs  08/11/14 1119  IRON 45  TIBC 223*  FERRITIN 190    Studies/Results: US Renal  08/11/2014   CLINICAL DATA:  Acute kidney injury and renal failure.  EXAM: RENAL/URINARY TRACT ULTRASOUND COMPLETE  COMPARISON:  None.  FINDINGS: Right Kidney:  Length: 13.0. Echogenicity within normal limits. No mass or hydronephrosis visualized.  Left Kidney:  Length: 11.5. Echogenicity within normal limits. No mass or hydronephrosis visualized.  Bladder:  Appears normal for  degree of bladder distention.  IMPRESSION: Normal renal ultrasound.   Electronically Signed   By: Aletta Edouard M.D.   On: 08/11/2014 11:00    I have reviewed the patient's current medications.  Assessment/Plan: Problem #1 acute kidney injury: Presently seems to be multifactorial. The etiology for acute kidney injury is most likely associated to dye-induced acute renal failure. At this moment ATN/nonsteroidal/ACE inhibitor/vancomycin we contribute to the etiology. Presently patient is none oliguric and her renal function seems to be stable. Problem #2 diabetes Problem #3 healthcare associated pneumonia: Patient presently on antibiotics, a febrile and her white cell count is normal. Problem #4 pleural and pericardial effusion Problem #5 anemia Problem #6 hypertension: Her blood pressure is reasonably controlled.  Plan: We'll start patient on normal saline at 125 cc per hour We'll check her basic metabolic panel in the morning.   LOS: 4 days   Cadon Raczka S 08/12/2014,8:33 AM

## 2014-08-12 NOTE — Progress Notes (Signed)
Patient seen and examined. Note reviewed  Patient was admitted to the hospital with acute systolic congestive heart failure, possible pneumonia and respiratory failure. Her respiratory symptoms appear to have improved. Chest exam is relatively unremarkable, she does not have any evidence of crackles or wheezes. She does not have any significant lower extremity edema. She has developed acute renal failure to her hospital stay which is felt to be multifactorial. She's been seen by nephrology then started on IV fluids. We'll continue to monitor renal function.  Patient continues to complain of nausea and vomiting. Her mother reports that she is unable to tolerate anything by mouth. She is also feeling very sleepy most of the time. She does have pain medications ordered as needed, per review of records does not indicate that she's received any pain medications recently. Will order abdominal ultrasound to further evaluate nausea and vomiting. Had LFTs and lipase to morning labs, check abdominal x-ray. Will give a trial of Reglan. Continue Zofran as needed. Discontinue Phenergan since she is feeling sleepy. Continue to follow. Discussed at length with the patient's mother  MEMON,JEHANZEB

## 2014-08-12 NOTE — Progress Notes (Signed)
Patient ID: Diana [name], female   DOB: [birth year], 28 y.o.   MRN: [id number]    Subjective:    No SOB, cough has improved. Still with some nausea and vomiting.   Objective:   Temp:  [98.2 F (36.8 C)-98.8 F (37.1 C)] 98.3 F (36.8 C) (09/24 0521) Pulse Rate:  [98-101] 101 (09/24 0521) Resp:  [18-20] 20 (09/24 0521) BP: (115-127)/(78-85) 127/85 mmHg (09/24 0521) SpO2:  [96 %-100 %] 100 % (09/24 0731) Weight:  [200 lb 2.8 oz (90.8 kg)] 200 lb 2.8 oz (90.8 kg) (09/24 0521) Last BM Date: 08/09/14  Filed Weights   08/10/14 [id number] 08/11/14 0540 08/12/14 0521  Weight: 205 lb 12.8 oz (93.35 kg) 202 lb 12.8 oz (91.989 kg) 200 lb 2.8 oz (90.8 kg)    Intake/Output Summary (Last 24 hours) at 08/12/14 0839 Last data filed at 08/12/14 0200  Gross per 24 hour  Intake    270 ml  Output    950 ml  Net   -680 ml    Telemetry: sinus tach low 100s  Exam:  General: NAD  Resp:CTAB  Cardiac: regular, rate 105, no m/r/g, no JVD  GI: abdomen soft, NT, ND  MSK:no LE edema  Neuro:no focal deficits   Psych: appropriate affect  Lab Results:  Basic Metabolic Panel:  Recent Labs Lab 08/10/14 0548 08/11/14 0630 08/12/14 0616  NA 138 139 139  K 3.6* 3.8 4.0  CL 102 101 103  CO2 26 26 25   GLUCOSE 195* 133* 110*  BUN 11 11 13   CREATININE 1.70* 2.23* 2.23*  CALCIUM 8.6 8.7 8.8    Liver Function Tests:  Recent Labs Lab 08/09/14 0538 08/10/14 0839  AST 12 12  ALT 9 7  ALKPHOS 71 65  BILITOT 0.3 0.2*  PROT 6.4 6.1  ALBUMIN 2.4* 2.3*    CBC:  Recent Labs Lab 08/09/14 0538 08/10/14 0548 08/11/14 0630  WBC 6.6 7.9 8.2  HGB 8.7* 8.9* 8.6*  HCT 25.5* 26.0* 25.4*  MCV 85.0 85.2 84.9  PLT 307 316 319    Cardiac Enzymes:  Recent Labs Lab 08/08/14 0456 08/08/14 0857 08/09/14 1551  TROPONINI <0.30 <0.30 <0.30    BNP:  Recent Labs  08/08/14 0456  PROBNP 2629.0*    Coagulation: No results found for this basename: INR,  in the last 168  hours  ECG:   Medications:   Scheduled Medications: . benzonatate  100 mg Oral TID  . carvedilol  3.125 mg Oral BID WC  . enoxaparin (LOVENOX) injection  40 mg Subcutaneous Q24H  . insulin aspart  0-15 Units Subcutaneous TID WC  . insulin aspart  0-5 Units Subcutaneous QHS  . insulin detemir  14 Units Subcutaneous QHS  . levalbuterol  0.63 mg Nebulization TID  . levofloxacin (LEVAQUIN) IV  750 mg Intravenous Q48H  . pantoprazole (PROTONIX) IV  40 mg Intravenous Q24H     Infusions:     PRN Medications:  acetaminophen, acetaminophen, alum & mag hydroxide-simeth, guaiFENesin-dextromethorphan, HYDROcodone-acetaminophen, morphine injection, ondansetron (ZOFRAN) IV, ondansetron, promethazine     Assessment/Plan   1. Acute systolic heart failure  - echo LVEF 20-25%, global hypokinesis, small pericardial effusion  - TSH 1.14, EKG non-ischemic, negative troponins  - chest imaging with evidence of multilobar pneumonia, this is likely a viral induced respiratory infection with viral induced CM, vs bacterial or atypical pneumonia with stress induced CM  - Cr trending up, her lasix and lisinopril have been held  - started coreg 3.125mg  bid, appears to  be tolerating well. Will increase to 6.25mg  bid today with plans for further titration as outpatient over time. When renal function improves start back ACE-I - fairly low likelihood of ischemic CM, no plans for ischemic testing at this time   2. AKI  - unclear etiology, unclear if related to diuresis (she is reported net positive however 2nd shift uop not documented) or if related to infection or Abx or contrast induced. With her nausea and vomiting she may very well by dry  -  If felt to be prerenal consider gentle IVF, from heart failure standpoint she does not currently appear volume overloaded and IVF would be ok  - off ACE-I and diuretics - followed by renal        Carlyle Dolly, M.D.

## 2014-08-12 NOTE — Progress Notes (Signed)
[facility] PROGRESS NOTE  Diana [name] [id number] DOB: [birth year] DOA: 08/08/2014 PCP: Pcp Not In System  Assessment/Plan: 1. Healthcare associated multilobar pneumonia. Oxygen saturation level 100% on room air. She remains afebrile with no leukocytosis. Antibiotics for 5 days.  Continue Tessalon Perles, oxygen, and Xopenex nebulizer. Strep pneumo antigen and Legionella antigen both negative; blood cultures if she runs a fever of greater than 101. 2. Bilateral pleural effusions. These could be parapneumonic or transudative.  3. Small pericardial effusion per CT and confirmed with echo. ECG with sinus tach at rate of 101. Evaluated by Dr Harl Bowie with cardiology who recommended lasix and coreg. 4. Type 2 diabetes mellitus. Uncontrolled. The patient was diagnosed with diabetes mellitus in 2005 and takes metformin at home. HgA1c 9.4. Continue to hold this. CBG range 117-122. Continue Levemir and continue SSI. Will monitor and determine discharge plan for DM when close to discharge, oral intake more reliable. Will clearly need close OP follow up for improved control.  5. Normocytic anemia. Stable. Iron 35, TIBC 207 RBC 3.06 otherwise anemia panel within the limits of normal. Consider iron supplement.  6. Acute systolic HF: elevated BNP echo with EF 20-25%. May be related to #1 and #2. Volume status +1.1L for today. Weight up but appears better on exam. Evaluated by Dr Harl Bowie who recommends holding lasix today and continuing coreg. He opines low likelihood of ischemic cardiomyopathy and therefore no plans for ischemic testing at this time.  7. Nausea/vomiting. Persistent. May be multifactorial ie. Meds, acute kidney injury. No coffee ground emesis. Lipase within the limits of normal. Abdominal xray without obstruction or ilieus. Continue zofran as needed and pepcid.  8. Sinus tachycardia: likely related to #1 and #6. Continues to improve. No events on tele. Monitor\ 9. Acute kidney injury:  stable but no improvement. Likely multifactorial i.e. Vancomycin induced, contrast dye, diuresis. Appreciate nephrology input. IV fluids per nephrology. Monitor 10. Trichomonas: in urine. flagyl     Code Status: full Family Communication: mother at bedside Disposition Plan: home when ready   Consultants:  Nephrology  cardiology  Procedures: 2 decho [id number] Systolic function was severely reduced. The estimated ejection fraction was in the range of 20% to 25%. - Mitral valve: There was mild regurgitation   Antibiotics: Vancomycin 08/08/14>> 08/10/14  Cefepime 08/08/14>> 08/11/14  Zithromax 08/08/14-08/09/14  levaquin 08/11/14>>   HPI/Subjective: Awake alert. Reports continued nausea with vomiting. Tolerating clear liquids in small amounts.  Objective: Filed Vitals:   08/12/14 0521  BP: 127/85  Pulse: 101  Temp: 98.3 F (36.8 C)  Resp: 20    Intake/Output Summary (Last 24 hours) at 08/12/14 1212 Last data filed at 08/12/14 0200  Gross per 24 hour  Intake    270 ml  Output    650 ml  Net   -380 ml   Filed Weights   08/10/14 0642 08/11/14 0540 08/12/14 0521  Weight: 93.35 kg (205 lb 12.8 oz) 91.989 kg (202 lb 12.8 oz) 90.8 kg (200 lb 2.8 oz)    Exam:   General:  Obese appears comfortable  Cardiovascular: tachycardic but regular i hear no murmur gallup or rub  Respiratory: normal effort improved air flow somewhat distant but clear  Abdomen: obese soft +BS non-tender to palpation  Musculoskeletal: no clubbing or cyanosis   Data Reviewed: Basic Metabolic Panel:  Recent Labs Lab 08/08/14 0416 08/09/14 0538 08/10/14 0548 08/11/14 0630 08/12/14 0616  NA 137 138 138 139 139  K 4.1 4.0 3.6* 3.8 4.0  CL 99 102  102 101 103  CO2 25 26 26 26 25   GLUCOSE 205* 191* 195* 133* 110*  BUN 8 8 11 11 13   CREATININE 0.79 1.03 1.70* 2.23* 2.23*  CALCIUM 8.9 8.5 8.6 8.7 8.8   Liver Function Tests:  Recent Labs Lab 08/09/14 0538 08/10/14 0839  AST 12 12  ALT 9  7  ALKPHOS 71 65  BILITOT 0.3 0.2*  PROT 6.4 6.1  ALBUMIN 2.4* 2.3*    Recent Labs Lab 08/09/14 1551  LIPASE 42   No results found for this basename: AMMONIA,  in the last 168 hours CBC:  Recent Labs Lab 08/08/14 0416 08/09/14 0538 08/10/14 0548 08/11/14 0630  WBC 8.9 6.6 7.9 8.2  NEUTROABS 6.9  --   --   --   HGB 10.0* 8.7* 8.9* 8.6*  HCT 28.3* 25.5* 26.0* 25.4*  MCV 84.5 85.0 85.2 84.9  PLT 312 307 316 319   Cardiac Enzymes:  Recent Labs Lab 08/08/14 0456 08/08/14 0857 08/09/14 1551  TROPONINI <0.30 <0.30 <0.30   BNP (last 3 results)  Recent Labs  08/08/14 0456  PROBNP 2629.0*   CBG:  Recent Labs Lab 08/11/14 1126 08/11/14 1641 08/11/14 2047 08/12/14 0746 08/12/14 1132  GLUCAP 128* 136* 109* 122* 117*    No results found for this or any previous visit (from the past 240 hour(s)).   Studies: US Renal  08/11/2014   CLINICAL DATA:  Acute kidney injury and renal failure.  EXAM: RENAL/URINARY TRACT ULTRASOUND COMPLETE  COMPARISON:  None.  FINDINGS: Right Kidney:  Length: 13.0. Echogenicity within normal limits. No mass or hydronephrosis visualized.  Left Kidney:  Length: 11.5. Echogenicity within normal limits. No mass or hydronephrosis visualized.  Bladder:  Appears normal for degree of bladder distention.  IMPRESSION: Normal renal ultrasound.   Electronically Signed   By: Aletta Edouard M.D.   On: 08/11/2014 11:00    Scheduled Meds: . benzonatate  100 mg Oral TID  . carvedilol  6.25 mg Oral BID WC  . enoxaparin (LOVENOX) injection  40 mg Subcutaneous Q24H  . insulin aspart  0-15 Units Subcutaneous TID WC  . insulin aspart  0-5 Units Subcutaneous QHS  . insulin detemir  14 Units Subcutaneous QHS  . levalbuterol  0.63 mg Nebulization TID  . levofloxacin (LEVAQUIN) IV  750 mg Intravenous Q48H  . metronidazole  2 g Intravenous Once  . pantoprazole (PROTONIX) IV  40 mg Intravenous Q24H   Continuous Infusions: . sodium chloride 125 mL/hr at  08/12/14 0848    Principal Problem:   HCAP (healthcare-associated pneumonia) Active Problems:   Pleural effusion, bilateral   Pericardial effusion   Type II or unspecified type diabetes mellitus without mention of complication, not stated as uncontrolled   Anemia, unspecified   Nausea with vomiting   Acute systolic heart failure   Acute renal failure   Sinus tachycardia   Obesity, unspecified   Trichomonas infection    Time spent: 35 minutes    [facility] Pager [phone]. If 7PM-7AM, please contact night-coverage at [email or web address], password [facility] 08/12/2014, 12:12 PM  LOS: 4 days

## 2014-08-12 NOTE — Progress Notes (Signed)
The patient refused nebulizer treatment last night stating she was sick. This morning patient at first refused nebulizer treatment with mask, RT offered treatment with mouth piece she agreed to try  It (she gave little to fair effort in its use. ) After three breaths she refused to continue stating it made her sick. BBS are clear, unlabored and RA SpO2 100%. Per the patient she has  not used her IS since it was initiated on 08/08/14 and she expresses no interest in wanting to use it.

## 2014-08-13 ENCOUNTER — Inpatient Hospital Stay (HOSPITAL_COMMUNITY): Payer: BC Managed Care – PPO

## 2014-08-13 LAB — CBC
HCT: 30 % — ABNORMAL LOW (ref 36.0–46.0)
Hemoglobin: 10.3 g/dL — ABNORMAL LOW (ref 12.0–15.0)
MCH: 29 pg (ref 26.0–34.0)
MCHC: 34.3 g/dL (ref 30.0–36.0)
MCV: 84.5 fL (ref 78.0–100.0)
Platelets: 374 10*3/uL (ref 150–400)
RBC: 3.55 MIL/uL — ABNORMAL LOW (ref 3.87–5.11)
RDW: 11.6 % (ref 11.5–15.5)
WBC: 9.7 10*3/uL (ref 4.0–10.5)

## 2014-08-13 LAB — IMMUNOFIXATION ELECTROPHORESIS
IgA: 483 mg/dL — ABNORMAL HIGH (ref 69–380)
IgG (Immunoglobin G), Serum: 830 mg/dL (ref 690–1700)
IgM, Serum: 214 mg/dL (ref 52–322)
Total Protein ELP: 5.8 g/dL — ABNORMAL LOW (ref 6.0–8.3)

## 2014-08-13 LAB — BASIC METABOLIC PANEL
Anion gap: 13 (ref 5–15)
BUN: 14 mg/dL (ref 6–23)
CO2: 24 mEq/L (ref 19–32)
Calcium: 8.7 mg/dL (ref 8.4–10.5)
Chloride: 104 mEq/L (ref 96–112)
Creatinine, Ser: 2.14 mg/dL — ABNORMAL HIGH (ref 0.50–1.10)
GFR calc Af Amer: 36 mL/min — ABNORMAL LOW (ref >90)
GFR calc non Af Amer: 31 mL/min — ABNORMAL LOW (ref >90)
Glucose, Bld: 108 mg/dL — ABNORMAL HIGH (ref 70–99)
Potassium: 3.8 mEq/L (ref 3.7–5.3)
Sodium: 141 mEq/L (ref 137–147)

## 2014-08-13 LAB — GLUCOSE, CAPILLARY
Glucose-Capillary: 131 mg/dL — ABNORMAL HIGH (ref 70–99)
Glucose-Capillary: 80 mg/dL (ref 70–99)
Glucose-Capillary: 102 mg/dL — ABNORMAL HIGH (ref 70–99)
Glucose-Capillary: 100 mg/dL — ABNORMAL HIGH (ref 70–99)

## 2014-08-13 LAB — PROTEIN ELECTROPHORESIS, SERUM
Albumin ELP: 47.6 % — ABNORMAL LOW (ref 55.8–66.1)
Alpha-1-Globulin: 7.4 % — ABNORMAL HIGH (ref 2.9–4.9)
Alpha-2-Globulin: 13.8 % — ABNORMAL HIGH (ref 7.1–11.8)
Beta 2: 9.1 % — ABNORMAL HIGH (ref 3.2–6.5)
Beta Globulin: 7.3 % — ABNORMAL HIGH (ref 4.7–7.2)
Gamma Globulin: 14.8 % (ref 11.1–18.8)
M-Spike, %: NOT DETECTED g/dL
Total Protein ELP: 5.8 g/dL — ABNORMAL LOW (ref 6.0–8.3)

## 2014-08-13 LAB — HEPATIC FUNCTION PANEL
ALT: 15 U/L (ref 0–35)
AST: 26 U/L (ref 0–37)
Albumin: 2.8 g/dL — ABNORMAL LOW (ref 3.5–5.2)
Alkaline Phosphatase: 66 U/L (ref 39–117)
Bilirubin, Direct: <0.2 mg/dL (ref 0.0–0.3)
Total Bilirubin: 0.5 mg/dL (ref 0.3–1.2)
Total Protein: 6.9 g/dL (ref 6.0–8.3)

## 2014-08-13 LAB — LIPASE, BLOOD: Lipase: 45 U/L (ref 11–59)

## 2014-08-13 LAB — COMPLEMENT, TOTAL: Compl, Total (CH50): >60 U/mL — ABNORMAL HIGH (ref 31–60)

## 2014-08-13 MED ORDER — PANTOPRAZOLE SODIUM 40 MG PO TBEC
40.0000 mg | DELAYED_RELEASE_TABLET | Freq: Every day | ORAL | Status: DC
  Administered 2014-08-13 – 2014-08-16 (×4): 40 mg via ORAL
  Filled 2014-08-13 (×4): qty 1

## 2014-08-13 MED ORDER — LEVOFLOXACIN IN D5W 750 MG/150ML IV SOLN
750.0000 mg | INTRAVENOUS | Status: DC
  Administered 2014-08-13 – 2014-08-15 (×2): 750 mg via INTRAVENOUS
  Filled 2014-08-13 (×2): qty 150

## 2014-08-13 MED ORDER — ONDANSETRON HCL 4 MG PO TABS
4.0000 mg | ORAL_TABLET | ORAL | Status: DC
  Administered 2014-08-13 – 2014-08-17 (×8): 4 mg via ORAL
  Filled 2014-08-13 (×13): qty 1

## 2014-08-13 MED ORDER — FUROSEMIDE 10 MG/ML IJ SOLN
60.0000 mg | Freq: Two times a day (BID) | INTRAMUSCULAR | Status: DC
  Administered 2014-08-13 – 2014-08-15 (×4): 60 mg via INTRAVENOUS
  Filled 2014-08-13 (×4): qty 6

## 2014-08-13 MED ORDER — LEVALBUTEROL HCL 0.63 MG/3ML IN NEBU: 0.6300 mg | INHALATION_SOLUTION | Freq: Three times a day (TID) | RESPIRATORY_TRACT | Status: DC | PRN

## 2014-08-13 NOTE — Consult Note (Signed)
Referring Provider: No ref. provider found Primary Care Physician:  Pcp Not In System Primary Gastroenterologist:  Dr.Anastasya Jewell  Reason for Consultation:  Nausea and vomiting  HPI: 28 year old African American lady admitted to the hospital September 20 with multi-lobar infiltrates, cardiomyopathy, congestive heart failure, acute renal failure along with nausea and vomiting. Hospital course has been complicated by acute renal failure. She has had some vague abdominal pain but according to the nursing staff and the medical record, has not required any narcotics so during this admission. Patient states she started having cough and dyspnea 2 weeks ago and that's when she started having vague nausea and vomiting. Patient does admit to taking occasional naproxen at home. Denies alcohol.  At least a 7 year history of diabetes. Patient says she doesn't "look after her diabetes".  Hemoglobin A1c 9.4. Nonobstructive bowel gas pattern on plain films. Right upper quadrant ultrasound reveals gallbladder in situ; no evidence of biliary obstruction or cholecystitis. Serum lipase and LFTs have been normal.   She is anemic with a mixed iron deficiency picture.   IV Reglan added to PPI therapy just today. Patient denies any chronic GI symptoms illnesses. She denies any chronic symptoms of reflux, abdominal pain dysphagia or odynophagia. No history of melena or hematochezia. She denies constipation or diarrhea. Nephrology and cardiology are seeing in consultation as well.   Past Medical History  Diagnosis Date  . Diabetes mellitus without complication   . HCAP (healthcare-associated pneumonia)   . Pleural effusion     bilateral  . Acute systolic heart failure   . Acute kidney injury   . Sinus tachycardia   . Obesity (BMI 30-39.9)     bmi 37.6    Past Surgical History  Procedure Laterality Date  . Appendectomy      Prior to Admission medications   Medication Sig Start Date End Date Taking? Authorizing  Provider  acetaminophen (TYLENOL) 500 MG tablet Take 1,000 mg by mouth daily as needed for moderate pain.   Yes Historical Provider, MD  albuterol (PROAIR HFA) 108 (90 BASE) MCG/ACT inhaler Inhale 2 puffs into the lungs daily as needed for wheezing or shortness of breath.  08/06/14 08/13/14 Yes Historical Provider, MD  metFORMIN (GLUCOPHAGE) 500 MG tablet Take 500 mg by mouth daily.   Yes Historical Provider, MD  naproxen sodium (ANAPROX) 220 MG tablet Take 220 mg by mouth 2 (two) times daily as needed (pain).   Yes Historical Provider, MD    Current Facility-Administered Medications  Medication Dose Route Frequency Provider Last Rate Last Dose  . acetaminophen (TYLENOL) tablet 650 mg  650 mg Oral Q6H PRN Rexene Alberts, MD       Or  . acetaminophen (TYLENOL) suppository 650 mg  650 mg Rectal Q6H PRN Rexene Alberts, MD      . alum & mag hydroxide-simeth (MAALOX/MYLANTA) 200-200-20 MG/5ML suspension 30 mL  30 mL Oral Q6H PRN Rexene Alberts, MD      . benzonatate (TESSALON) capsule 100 mg  100 mg Oral TID Rexene Alberts, MD   100 mg at 08/09/14 1511  . carvedilol (COREG) tablet 6.25 mg  6.25 mg Oral BID WC Arnoldo Lenis, MD   6.25 mg at 08/13/14 0855  . enoxaparin (LOVENOX) injection 40 mg  40 mg Subcutaneous Q24H Rexene Alberts, MD   40 mg at 08/13/14 0854  . furosemide (LASIX) injection 60 mg  60 mg Intravenous BID Harriett Sine, MD      . guaiFENesin-dextromethorphan (ROBITUSSIN DM) 100-10  MG/5ML syrup 5 mL  5 mL Oral Q4H PRN Rexene Alberts, MD      . HYDROcodone-acetaminophen (NORCO/VICODIN) 5-325 MG per tablet 1-2 tablet  1-2 tablet Oral Q4H PRN Rexene Alberts, MD      . insulin aspart (novoLOG) injection 0-15 Units  0-15 Units Subcutaneous TID WC Rexene Alberts, MD   2 Units at 08/13/14 [phone]  . insulin aspart (novoLOG) injection 0-5 Units  0-5 Units Subcutaneous QHS Rexene Alberts, MD   2 Units at 08/09/14 2219  . insulin detemir (LEVEMIR) injection 14 Units  14 Units Subcutaneous QHS Radene Gunning, NP   14 Units at 08/12/14 2247  . levalbuterol (XOPENEX) nebulizer solution 0.63 mg  0.63 mg Nebulization TID Leida Lauth, RRT   0.63 mg at 08/12/14 0720  . levofloxacin (LEVAQUIN) IVPB 750 mg  750 mg Intravenous Q48H Lezlie Octave Black, NP   750 mg at 08/13/14 1227  . metoCLOPramide (REGLAN) injection 10 mg  10 mg Intravenous 4 times per day Kathie Dike, MD   10 mg at 08/13/14 1218  . morphine 2 MG/ML injection 2 mg  2 mg Intravenous Q4H PRN Rexene Alberts, MD      . ondansetron [facility]) tablet 4 mg  4 mg Oral Q6H PRN Rexene Alberts, MD       Or  . ondansetron [facility]) injection 4 mg  4 mg Intravenous Q6H PRN Rexene Alberts, MD   4 mg at 08/12/14 2144  . ondansetron (ZOFRAN) tablet 4 mg  4 mg Oral Q4H Lezlie Octave Black, NP      . pantoprazole (PROTONIX) EC tablet 40 mg  40 mg Oral Daily Kathie Dike, MD   40 mg at 08/13/14 0956    Allergies as of 08/08/2014  . (No Known Allergies)    History reviewed. No pertinent family history.  History   Social History  . Marital Status: Single    Spouse Name: N/A    Number of Children: N/A  . Years of Education: N/A   Occupational History  . Not on file.   Social History Main Topics  . Smoking status: Never Smoker   . Smokeless tobacco: Not on file  . Alcohol Use: No  . Drug Use: No  . Sexual Activity: No   Other Topics Concern  . Not on file   Social History Narrative  . No narrative on file    Review of Systems: GI: Denies vomiting blood, jaundice, and fecal incontinence.   Denies dysphagia or odynophagia. Derm: Denies rash, itching, dry skin, hives, moles, warts, or unhealing ulcers.  Psych: Denies depression, anxiety, memory loss, suicidal ideation, hallucinations, paranoia, and confusion. Heme: Denies bruising, bleeding, and enlarged lymph nodes.   Physical Exam: Vital signs in last 24 hours: Temp:  [98.4 F (36.9 C)] 98.4 F (36.9 C) (09/25 [id number]) Pulse Rate:  [100-103] 103 (09/25 0757) Resp:  [20] 20  (09/25 0614) BP: (126-135)/(84-88) 135/88 mmHg (09/25 0614) SpO2:  [97 %-100 %] 97 % (09/25 1448) Weight:  [200 lb 6.4 oz (90.9 kg)] 200 lb 6.4 oz (90.9 kg) (09/25 0614) Last BM Date: 08/09/14 General:   Appears not to feel well. No eye contact with me. Answers questions but otherwise not very conversant. Intermittently heaving during the encounter.  Head:  Normocephalic and atraumatic. Eyes:  Sclera clear, no icterus.   Conjunctiva pink. Ears:  Normal auditory acuity. Nose:  No deformity, discharge,  or lesions. Mouth:  No deformity or lesions, dentition normal. Neck:  Supple; no  masses or thyromegaly. Lungs:  Poor inspiratory effort when heaving.   Heart:  Regular rate and rhythm; no murmurs, clicks, rubs,  or gallops. Abdomen:  Nondistended. Obese. Bowel sounds inactive. No succussion splash. Nontender to palpation. No appreciable mass or hepatosplenomegaly.  .  Intake/Output from previous day: Sep 04, 2023 0701 - 09/25 0700 In: 1081.3 [I.V.:1081.3] Out: 400 [Urine:400] Intake/Output this shift:    Lab Results:  Recent Labs  08/11/14 0630 08/13/14 0607  WBC 8.2 9.7  HGB 8.6* 10.3*  HCT 25.4* 30.0*  PLT 319 374   BMET  Recent Labs  08/11/14 0630 03-Sep-2014 0616 08/13/14 0607  NA 139 139 141  K 3.8 4.0 3.8  CL 101 103 104  CO2 26 25 24   GLUCOSE 133* 110* 108*  BUN 11 13 14   CREATININE 2.23* 2.23* 2.14*  CALCIUM 8.7 8.8 8.7   LFT  Recent Labs  08/13/14 0607  PROT 6.9  ALBUMIN 2.8*  AST 26  ALT 15  ALKPHOS 66  BILITOT 0.5  BILIDIR <0.2  IBILI NOT CALCULATED   PT/INR No results found for this basename: LABPROT, INR,  in the last 72 hours Hepatitis Panel  Recent Labs  08/11/14 1119  HEPBSAG NEGATIVE  HCVAB NEGATIVE  HEPAIGM NON REACTIVE  HEPBIGM NON REACTIVE   C-Diff No results found for this basename: CDIFFTOX,  in the last 72 hours  Studies/Results: Dg Abd 2 Views  03-Sep-2014   CLINICAL DATA:  Nausea, vomiting.  Abdominal pain.  EXAM: ABDOMEN -  2 VIEW  COMPARISON:  Abdominal radiograph 08/09/2014  FINDINGS: Leads overlie the patient. Small bilateral pleural effusions and heterogeneous opacities within the lung bases. Relative paucity of small bowel gas. No definite evidence for small bowel obstruction. Regional skeleton is unremarkable.  IMPRESSION: Paucity of small bowel gas.  No definite evidence for obstruction.  Bilateral pleural effusions and heterogeneous opacities within the bilateral lung bases.   Electronically Signed   By: Lovey Newcomer M.D.   On: 03-Sep-2014 19:18   US Abdomen Limited Ruq  08/13/2014   CLINICAL DATA:  Persistent nausea, vomiting  EXAM: US ABDOMEN LIMITED - RIGHT UPPER QUADRANT  COMPARISON:  None.  FINDINGS: Gallbladder:  No gallstones or wall thickening visualized. No sonographic Murphy sign noted.  Common bile duct:  Diameter: 3.2 mm  Liver:  No focal lesion identified. Within normal limits in parenchymal echogenicity.  IMPRESSION: Unremarkable right upper quadrant ultrasound.   Electronically Signed   By: Lahoma Crocker M.D.   On: 08/13/2014 09:45    Impression:   28 year old lady in the hospital with dyspnea and pulmonary infiltrates, chest CT shows pleural  and  pericardial effusion. Atypical presentation for pneumonia. 2 week history of insidiously progressive nausea and more recently vomiting in the setting of heart failure, acute renal failure and poorly controlled diabetes mellitus.  As discussed with Dr. Roderic Palau, she may or may not have a pulmonary infection.  Premorbidly, she reports no GI symptoms. Ultrasound, LFTs and serum lipase negative or normal. She was started on Reglan last evening with some improvement in her nausea over the past 12 hours or so.  I suspect her nausea and vomiting are more likely "bystander" symptoms related to multisystem illness superimposed on poorly controlled diabetes. Medication effect remains a potential contributing factor. No evidence of bowel obstruction or biliary tract disease.  This would be an atypical presentation for peptic ulcer disease. There is reported chronic NSAID use, however.  Recommendations:   Agree with empiric trial of IV Reglan along  with continued acid suppression therapy. She does not seem to have any side effects related to Reglan at this time.  Better glycemic control going forward will be very important in her overall management. She will be reassessed tomorrow by Dr. Laural Golden. There is no need for a diagnostic EGD at this time.  However,  recommendations could change in the near future depending on her clinical course.  I have discussed my findings and recommendations with Dr. Ulice Bold via telephone this evening. I'd like to thank Dr. Freddy Jaksch for asking me to see this lady this afternoon.         Notice:  This dictation was prepared with Dragon dictation along with smaller phrase technology. Any transcriptional errors that result from this process are unintentional and may not be corrected upon review.

## 2014-08-13 NOTE — Progress Notes (Signed)
PT has been refusing nebs, states they make her sick. Upon RT assessment her breath sounds are decreased. Full assessment to follow , nebs will be changed to PRN.

## 2014-08-13 NOTE — Progress Notes (Signed)
Patient seen and examined, above note reviewed.  Patient reports that she does feel better today. She had some vomiting this morning but was able to tolerate fruit for lunch. She was started on Reglan yesterday. Appreciate GI input. Recommendations are to continue with current treatments with antiemetics, Reglan and proton pump inhibitors. At this time it was not felt that she needs an upper endoscopy.  Creatinine appears to be stable. She is continued on IV fluids will be started on intravenous Lasix per nephrology. She is also followed by cardiology for her systolic dysfunction.  Johnanna Bakke

## 2014-08-13 NOTE — Progress Notes (Signed)
Patient ID: Diana [name], female   DOB: 02-02-1986, 28 y.o.   MRN: [id number]    Subjective:    No SOB  Objective:   Temp:  [98 F (36.7 C)-98.4 F (36.9 C)] 98.4 F (36.9 C) (09/25 [id number]) Pulse Rate:  [100-103] 103 (09/25 0757) Resp:  [20] 20 (09/25 0614) BP: (120-135)/(84-88) 135/88 mmHg (09/25 0614) SpO2:  [100 %] 100 % (09/25 0757) Weight:  [200 lb 6.4 oz (90.9 kg)] 200 lb 6.4 oz (90.9 kg) (09/25 0614) Last BM Date: 08/09/14  Filed Weights   08/11/14 0540 08/12/14 0521 08/13/14 [id number]  Weight: 202 lb 12.8 oz (91.989 kg) 200 lb 2.8 oz (90.8 kg) 200 lb 6.4 oz (90.9 kg)    Intake/Output Summary (Last 24 hours) at 08/13/14 0854 Last data filed at 08/12/14 1751  Gross per 24 hour  Intake 1081.25 ml  Output    400 ml  Net 681.25 ml    Telemetry: NSR and sinus tach low 100s  Exam:  General: NAD  Resp: CTAB  Cardiac: RRR, no m/r/g, no JVD  GI: abdomen soft, NT, ND  MSK:no LE edema  Neuro: no focal deficits  Psych: appropriate affect  Lab Results:  Basic Metabolic Panel:  Recent Labs Lab 08/11/14 0630 08/12/14 0616 08/13/14 0607  NA 139 139 141  K 3.8 4.0 3.8  CL 101 103 104  CO2 26 25 24   GLUCOSE 133* 110* 108*  BUN 11 13 14   CREATININE 2.23* 2.23* 2.14*  CALCIUM 8.7 8.8 8.7    Liver Function Tests:  Recent Labs Lab 08/09/14 0538 08/10/14 0839 08/13/14 0607  AST 12 12 26   ALT 9 7 15   ALKPHOS 71 65 66  BILITOT 0.3 0.2* 0.5  PROT 6.4 6.1 6.9  ALBUMIN 2.4* 2.3* 2.8*    CBC:  Recent Labs Lab 08/10/14 0548 08/11/14 0630 08/13/14 0607  WBC 7.9 8.2 9.7  HGB 8.9* 8.6* 10.3*  HCT 26.0* 25.4* 30.0*  MCV 85.2 84.9 84.5  PLT 316 319 374    Cardiac Enzymes:  Recent Labs Lab 08/08/14 0456 08/08/14 0857 08/09/14 1551  TROPONINI <0.30 <0.30 <0.30    BNP:  Recent Labs  08/08/14 0456  PROBNP 2629.0*    Coagulation: No results found for this basename: INR,  in the last 168 hours  ECG:   Medications:   Scheduled  Medications: . benzonatate  100 mg Oral TID  . carvedilol  6.25 mg Oral BID WC  . enoxaparin (LOVENOX) injection  40 mg Subcutaneous Q24H  . insulin aspart  0-15 Units Subcutaneous TID WC  . insulin aspart  0-5 Units Subcutaneous QHS  . insulin detemir  14 Units Subcutaneous QHS  . levalbuterol  0.63 mg Nebulization TID  . levofloxacin (LEVAQUIN) IV  750 mg Intravenous Q48H  . metoCLOPramide (REGLAN) injection  10 mg Intravenous 4 times per day  . pantoprazole (PROTONIX) IV  40 mg Intravenous Q24H     Infusions: . sodium chloride 125 mL/hr at 08/13/14 0137     PRN Medications:  acetaminophen, acetaminophen, alum & mag hydroxide-simeth, guaiFENesin-dextromethorphan, HYDROcodone-acetaminophen, morphine injection, ondansetron (ZOFRAN) IV, ondansetron     Assessment/Plan   1. Acute systolic heart failure  - echo LVEF 20-25%, global hypokinesis, small pericardial effusion  - TSH 1.14, EKG non-ischemic, negative troponins  - chest imaging with evidence of multilobar pneumonia, this is likely a viral induced respiratory infection with viral induced CM, vs bacterial or atypical pneumonia with stress induced CM  - Cr initially increased, her lasix  and lisinopril have been held, now Cr trending down - continue coreg, when renal function improves likely restart low dose ACE-I, this can be done as outpatient as well. If persistent renal dysfunction will consider hydral/nitrates, however restarting ACE would be higher priority. - fairly low likelihood of ischemic CM, no plans for ischemic testing at this time  - she is postiive 663mL yesterday, +1.1 liters since admission. No evidence of volume overload at this time.   2. AKI  - unclear etiology, unclear if related to diuresis (she is reported net positive however 2nd shift uop not documented) or if related to infection or Abx or contrast induced. With her nausea and vomiting she may very well by dry  - If felt to be prerenal consider gentle  IVF, from heart failure standpoint she does not currently appear volume overloaded and IVF would be ok  - off ACE-I and diuretics  - followed by renal   If she goes home over weekend will need close f/u with [place] within 1 week, either myself of NP La[place]. Can consider prn lasix at discharge, would not place on scheduled lasix.       Carlyle Dolly, M.D..

## 2014-08-13 NOTE — Progress Notes (Signed)
Subjective: Interval History: Patient still complains of abdominal pain but is some what better. She has some nausea and feels like throwing up when ever she wants to eat. She denies any difficulty in breathing or orthopnea.   Objective: Vital signs in last 24 hours: Temp:  [98 F (36.7 C)-98.4 F (36.9 C)] 98.4 F (36.9 C) (09/25 [id number]) Pulse Rate:  [100-103] 103 (09/25 0757) Resp:  [20] 20 (09/25 0614) BP: (120-135)/(84-88) 135/88 mmHg (09/25 0614) SpO2:  [100 %] 100 % (09/25 0757) Weight:  [90.9 kg (200 lb 6.4 oz)] 90.9 kg (200 lb 6.4 oz) (09/25 0614) Weight change: 0.1 kg (3.5 oz)  Intake/Output from previous day: 09/24 0701 - 09/25 0700 In: 1081.3 [I.V.:1081.3] Out: 400 [Urine:400] Intake/Output this shift:    She si alert and in no apparent distress Chest decrease breath sound no rale or wheezing Heart : RRR no murmur Abdomen: Slight tenderness on the left flank but no rebound tenderness and  Positive bowl sound No edema  Lab Results:  Recent Labs  08/11/14 0630 08/13/14 0607  WBC 8.2 9.7  HGB 8.6* 10.3*  HCT 25.4* 30.0*  PLT 319 374   BMET:   Recent Labs  08/12/14 0616 08/13/14 0607  NA 139 141  K 4.0 3.8  CL 103 104  CO2 25 24  GLUCOSE 110* 108*  BUN 13 14  CREATININE 2.23* 2.14*  CALCIUM 8.8 8.7   No results found for this basename: PTH,  in the last 72 hours Iron Studies:   Recent Labs  08/11/14 1119  IRON 45  TIBC 223*  FERRITIN 190    Studies/Results: US Renal  08/11/2014   CLINICAL DATA:  Acute kidney injury and renal failure.  EXAM: RENAL/URINARY TRACT ULTRASOUND COMPLETE  COMPARISON:  None.  FINDINGS: Right Kidney:  Length: 13.0. Echogenicity within normal limits. No mass or hydronephrosis visualized.  Left Kidney:  Length: 11.5. Echogenicity within normal limits. No mass or hydronephrosis visualized.  Bladder:  Appears normal for degree of bladder distention.  IMPRESSION: Normal renal ultrasound.   Electronically Signed   By: Aletta Edouard M.D.   On: 08/11/2014 11:00   Dg Abd 2 Views  08/12/2014   CLINICAL DATA:  Nausea, vomiting.  Abdominal pain.  EXAM: ABDOMEN - 2 VIEW  COMPARISON:  Abdominal radiograph 08/09/2014  FINDINGS: Leads overlie the patient. Small bilateral pleural effusions and heterogeneous opacities within the lung bases. Relative paucity of small bowel gas. No definite evidence for small bowel obstruction. Regional skeleton is unremarkable.  IMPRESSION: Paucity of small bowel gas.  No definite evidence for obstruction.  Bilateral pleural effusions and heterogeneous opacities within the bilateral lung bases.   Electronically Signed   By: Lovey Newcomer M.D.   On: 08/12/2014 19:18   US Abdomen Limited Ruq  08/13/2014   CLINICAL DATA:  Persistent nausea, vomiting  EXAM: US ABDOMEN LIMITED - RIGHT UPPER QUADRANT  COMPARISON:  None.  FINDINGS: Gallbladder:  No gallstones or wall thickening visualized. No sonographic Murphy sign noted.  Common bile duct:  Diameter: 3.2 mm  Liver:  No focal lesion identified. Within normal limits in parenchymal echogenicity.  IMPRESSION: Unremarkable right upper quadrant ultrasound.   Electronically Signed   By: Lahoma Crocker M.D.   On: 08/13/2014 09:45    I have reviewed the patient's current medications.  Assessment/Plan: Problem #1 acute kidney injury: Presently seems to be multifactorial. The etiology for acute kidney injury is most likely associated to dye-induced acute renal failure. At this moment  ATN/nonsteroidal/ACE inhibitor/vancomycin we contribute to the etiology. Her renal function is improving. Problem #2 diabetes Problem #3 healthcare associated pneumonia: Bilateral. wih some pleural effusion Patient presently on antibiotics, a febrile and her white cell count is normal. Problem #4 cardiomyopathy with low EF. Etiology not clear possibly viral Problem #5 anemia Problem #6 hypertension: Her blood pressure is reasonably controlled.  Plan: Continue with hydration and decrease  ivf to 100 cc/ min Start on lasix 60 mg iv bid We'll check her basic metabolic panel in the morning.   LOS: 5 days   Braxton Weisbecker S 08/13/2014,10:17 AM

## 2014-08-13 NOTE — Progress Notes (Signed)
The patient is receiving Protonix by the intravenous route.  Based on criteria approved by the Pharmacy and [place], the medication is being converted to the equivalent oral dose form.  These criteria include: -No Active GI bleeding -Able to tolerate diet of full liquids (or better) or tube feeding OR able to tolerate other medications by the oral or enteral route  If you have any questions about this conversion, please contact the Pharmacy Department (ext [phone]).  Thank you.  Ena Dawley, [facility] 08/13/2014 8:56 AM

## 2014-08-13 NOTE — Progress Notes (Signed)
[facility] PROGRESS NOTE  Diana [name] [id number] DOB: [birth year] DOA: 08/08/2014 PCP: Pcp Not In System  Assessment/Plan: 1. Healthcare associated multilobar pneumonia. Normal respiratory effort.  She remains afebrile with no leukocytosis. Antibiotics for 5 days. Strep pneumo antigen and Legionella antigen both negative. 2. Bilateral pleural effusions. These could be parapneumonic or transudative.  3. Small pericardial effusion per CT and confirmed with echo. ECG with sinus tach at rate of 101. Evaluated by Dr Harl Bowie with cardiology who recommended lasix and coreg. Will need close follow up with cardiology when discharged. 4. Type 2 diabetes mellitus. The patient was diagnosed with diabetes mellitus in 2005 and takes metformin at home. HgA1c 9.4. Continue to hold this. CBG range 104-131. Continue Levemir and continue SSI. Will monitor and determine discharge plan for DM when close to discharge, oral intake more reliable. Will clearly need close OP follow up for improved control.  5. Normocytic anemia. Stable. Iron 35, TIBC 207 RBC 3.06 otherwise anemia panel within the limits of normal. Consider iron supplement.  6. Acute systolic HF: elevated BNP echo with EF 20-25%. May be related to #1 and #2. Volume status +1.1L for today. Weight stable. Evaluated by cardiology and started on coreg.  He opines low likelihood of ischemic cardiomyopathy and therefore no plans for ischemic testing at this time.  7. Nausea/vomiting. Persistent. One episode of emesis this am.  reglan started and she is requesting food today. May be multifactorial ie. Meds, acute kidney injury. Lipase within the limits of normal. Repeat abdominal xray without obstruction or ilieus and abdominal US unremarkable. Will schedule zofran and continue pepcid. Request GI consult 8. Sinus tachycardia: likely related to #1 and #6. Continues to improve. No events on tele. Monitor\ 9. Acute kidney injury:  little improvement. Likely  multifactorial i.e. Vancomycin induced, contrast dye, diuresis. Appreciate nephrology input. IV fluids decreased and lasix resumed  per nephrology. Monitor 10. Trichomonas: in urine. Flagyl. Counseled to notify partners   1. **  Code Status: full Family Communication: none present Disposition Plan: home when ready   Consultants:  cardiology  Procedures:  none  Antibiotics:  Flagyl 08/12/14 Vancomycin 08/08/14>> 08/10/14  Cefepime 08/08/14>> 08/11/14  Zithromax 08/08/14-08/09/14  levaquin 08/11/14>>08/13/14    HPI/Subjective: Awake alert. Reports episode of emesis during [place]. Requesting fruit for lunch. Denies pain  Objective: Filed Vitals:   08/13/14 0757  BP:   Pulse: 103  Temp:   Resp:     Intake/Output Summary (Last 24 hours) at 08/13/14 1116 Last data filed at 08/13/14 0900  Gross per 24 hour  Intake 1081.25 ml  Output    200 ml  Net 881.25 ml   Filed Weights   08/11/14 0540 08/12/14 0521 08/13/14 0614  Weight: 91.989 kg (202 lb 12.8 oz) 90.8 kg (200 lb 2.8 oz) 90.9 kg (200 lb 6.4 oz)    Exam:   General:  Obese appears comfortable  Cardiovascular: RRR No MGR No LE edema PPP  Respiratory: normal effort BS with good air flow no wheeze  Abdomen: obese soft +BS non-tender to palpation  Musculoskeletal: no clubbing or cyanosis   Data Reviewed: Basic Metabolic Panel:  Recent Labs Lab 08/09/14 0538 08/10/14 0548 08/11/14 0630 08/12/14 0616 08/13/14 0607  NA 138 138 139 139 141  K 4.0 3.6* 3.8 4.0 3.8  CL 102 102 101 103 104  CO2 26 26 26 25 24   GLUCOSE 191* 195* 133* 110* 108*  BUN 8 11 11 13 14   CREATININE 1.03 1.70* 2.23* 2.23* 2.14*  CALCIUM 8.5 8.6 8.7 8.8 8.7   Liver Function Tests:  Recent Labs Lab 08/09/14 0538 08/10/14 0839 08/13/14 0607  AST 12 12 26   ALT 9 7 15   ALKPHOS 71 65 66  BILITOT 0.3 0.2* 0.5  PROT 6.4 6.1 6.9  ALBUMIN 2.4* 2.3* 2.8*    Recent Labs Lab 08/09/14 1551 08/13/14 0607  LIPASE 42 45   No results  found for this basename: AMMONIA,  in the last 168 hours CBC:  Recent Labs Lab 08/08/14 0416 08/09/14 0538 08/10/14 0548 08/11/14 0630 08/13/14 0607  WBC 8.9 6.6 7.9 8.2 9.7  NEUTROABS 6.9  --   --   --   --   HGB 10.0* 8.7* 8.9* 8.6* 10.3*  HCT 28.3* 25.5* 26.0* 25.4* 30.0*  MCV 84.5 85.0 85.2 84.9 84.5  PLT 312 307 316 319 374   Cardiac Enzymes:  Recent Labs Lab 08/08/14 0456 08/08/14 0857 08/09/14 1551  TROPONINI <0.30 <0.30 <0.30   BNP (last 3 results)  Recent Labs  08/08/14 0456  PROBNP 2629.0*   CBG:  Recent Labs Lab 08/12/14 0746 08/12/14 1132 08/12/14 1641 08/12/14 2026 08/13/14 0749  GLUCAP 122* 117* 110* 104* 131*    No results found for this or any previous visit (from the past 240 hour(s)).   Studies: Dg Abd 2 Views  08/12/2014   CLINICAL DATA:  Nausea, vomiting.  Abdominal pain.  EXAM: ABDOMEN - 2 VIEW  COMPARISON:  Abdominal radiograph 08/09/2014  FINDINGS: Leads overlie the patient. Small bilateral pleural effusions and heterogeneous opacities within the lung bases. Relative paucity of small bowel gas. No definite evidence for small bowel obstruction. Regional skeleton is unremarkable.  IMPRESSION: Paucity of small bowel gas.  No definite evidence for obstruction.  Bilateral pleural effusions and heterogeneous opacities within the bilateral lung bases.   Electronically Signed   By: Lovey Newcomer M.D.   On: 08/12/2014 19:18   US Abdomen Limited Ruq  08/13/2014   CLINICAL DATA:  Persistent nausea, vomiting  EXAM: US ABDOMEN LIMITED - RIGHT UPPER QUADRANT  COMPARISON:  None.  FINDINGS: Gallbladder:  No gallstones or wall thickening visualized. No sonographic Murphy sign noted.  Common bile duct:  Diameter: 3.2 mm  Liver:  No focal lesion identified. Within normal limits in parenchymal echogenicity.  IMPRESSION: Unremarkable right upper quadrant ultrasound.   Electronically Signed   By: Lahoma Crocker M.D.   On: 08/13/2014 09:45    Scheduled Meds: .  benzonatate  100 mg Oral TID  . carvedilol  6.25 mg Oral BID WC  . enoxaparin (LOVENOX) injection  40 mg Subcutaneous Q24H  . furosemide  60 mg Intravenous BID  . insulin aspart  0-15 Units Subcutaneous TID WC  . insulin aspart  0-5 Units Subcutaneous QHS  . insulin detemir  14 Units Subcutaneous QHS  . levalbuterol  0.63 mg Nebulization TID  . levofloxacin (LEVAQUIN) IV  750 mg Intravenous Q48H  . metoCLOPramide (REGLAN) injection  10 mg Intravenous 4 times per day  . pantoprazole  40 mg Oral Daily   Continuous Infusions: . sodium chloride 100 mL/hr at 08/13/14 1025    Principal Problem:   HCAP (healthcare-associated pneumonia) Active Problems:   Pleural effusion, bilateral   Pericardial effusion   Type II or unspecified type diabetes mellitus without mention of complication, not stated as uncontrolled   Anemia, unspecified   Nausea with vomiting   Acute systolic heart failure   Acute renal failure   Sinus tachycardia   Obesity, unspecified  Trichomonas infection    Time spent: 35 minutes    [facility] Pager [phone]. If 7PM-7AM, please contact night-coverage at [email or web address], password [facility] 08/13/2014, 11:16 AM  LOS: 5 days

## 2014-08-14 DIAGNOSIS — D649 Anemia, unspecified: Secondary | ICD-10-CM

## 2014-08-14 DIAGNOSIS — J810 Acute pulmonary edema: Secondary | ICD-10-CM

## 2014-08-14 LAB — GLUCOSE, CAPILLARY
Glucose-Capillary: 127 mg/dL — ABNORMAL HIGH (ref 70–99)
Glucose-Capillary: 183 mg/dL — ABNORMAL HIGH (ref 70–99)
Glucose-Capillary: 159 mg/dL — ABNORMAL HIGH (ref 70–99)
Glucose-Capillary: 131 mg/dL — ABNORMAL HIGH (ref 70–99)
Glucose-Capillary: 152 mg/dL — ABNORMAL HIGH (ref 70–99)

## 2014-08-14 LAB — BASIC METABOLIC PANEL
Anion gap: 15 (ref 5–15)
BUN: 14 mg/dL (ref 6–23)
CO2: 21 mEq/L (ref 19–32)
Calcium: 8.4 mg/dL (ref 8.4–10.5)
Chloride: 100 mEq/L (ref 96–112)
Creatinine, Ser: 2.08 mg/dL — ABNORMAL HIGH (ref 0.50–1.10)
GFR calc Af Amer: 37 mL/min — ABNORMAL LOW (ref >90)
GFR calc non Af Amer: 32 mL/min — ABNORMAL LOW (ref >90)
Glucose, Bld: 178 mg/dL — ABNORMAL HIGH (ref 70–99)
Potassium: 3.7 mEq/L (ref 3.7–5.3)
Sodium: 136 mEq/L — ABNORMAL LOW (ref 137–147)

## 2014-08-14 LAB — URINALYSIS, ROUTINE W REFLEX MICROSCOPIC
Bilirubin Urine: NEGATIVE
Glucose, UA: 100 mg/dL — AB
Ketones, ur: NEGATIVE mg/dL
Nitrite: NEGATIVE
Protein, ur: 30 mg/dL — AB
Specific Gravity, Urine: 1.01 (ref 1.005–1.030)
Urobilinogen, UA: 0.2 mg/dL (ref 0.0–1.0)
pH: 5.5 (ref 5.0–8.0)

## 2014-08-14 LAB — URINE MICROSCOPIC-ADD ON

## 2014-08-14 NOTE — Progress Notes (Signed)
Subjective: Interval History: Patient states that she is feeling much better as far her breathing is concerned. Her main problem is nausea and unable to eat but no abdominal pain  Objective: Vital signs in last 24 hours: Temp:  [98.2 F (36.8 C)-98.5 F (36.9 C)] 98.2 F (36.8 C) (09/26 0558) Pulse Rate:  [105-115] 110 (09/26 0558) Resp:  [18-20] 20 (09/26 0558) BP: (113-125)/(74-91) 113/74 mmHg (09/26 0558) SpO2:  [97 %-100 %] 100 % (09/26 0558) Weight:  [92.171 kg (203 lb 3.2 oz)] 92.171 kg (203 lb 3.2 oz) (09/26 0558) Weight change: 1.271 kg (2 lb 12.8 oz)  Intake/Output from previous day: 09/25 0701 - 09/26 0700 In: 270 [P.O.:120; IV Piggyback:150] Out: 550 [Urine:550] Intake/Output this shift: Total I/O In: 80 [P.O.:80] Out: -   She si alert and in no apparent distress Chest decrease breath sound no rale or wheezing Heart : RRR no murmur Abdomen: Slight tenderness on the left flank but no rebound tenderness and  Positive bowl sound No edema  Lab Results:  Recent Labs  08/13/14 0607  WBC 9.7  HGB 10.3*  HCT 30.0*  PLT 374   BMET:   Recent Labs  08/13/14 0607 08/14/14 0834  NA 141 136*  K 3.8 3.7  CL 104 100  CO2 24 21  GLUCOSE 108* 178*  BUN 14 14  CREATININE 2.14* 2.08*  CALCIUM 8.7 8.4   No results found for this basename: PTH,  in the last 72 hours Iron Studies:   Recent Labs  08/11/14 1119  IRON 45  TIBC 223*  FERRITIN 190    Studies/Results: Dg Abd 2 Views  September 07, 2014   CLINICAL DATA:  Nausea, vomiting.  Abdominal pain.  EXAM: ABDOMEN - 2 VIEW  COMPARISON:  Abdominal radiograph 08/09/2014  FINDINGS: Leads overlie the patient. Small bilateral pleural effusions and heterogeneous opacities within the lung bases. Relative paucity of small bowel gas. No definite evidence for small bowel obstruction. Regional skeleton is unremarkable.  IMPRESSION: Paucity of small bowel gas.  No definite evidence for obstruction.  Bilateral pleural effusions and  heterogeneous opacities within the bilateral lung bases.   Electronically Signed   By: Lovey Newcomer M.D.   On: 09/07/14 19:18   US Abdomen Limited Ruq  08/13/2014   CLINICAL DATA:  Persistent nausea, vomiting  EXAM: US ABDOMEN LIMITED - RIGHT UPPER QUADRANT  COMPARISON:  None.  FINDINGS: Gallbladder:  No gallstones or wall thickening visualized. No sonographic Murphy sign noted.  Common bile duct:  Diameter: 3.2 mm  Liver:  No focal lesion identified. Within normal limits in parenchymal echogenicity.  IMPRESSION: Unremarkable right upper quadrant ultrasound.   Electronically Signed   By: Lahoma Crocker M.D.   On: 08/13/2014 09:45    I have reviewed the patient's current medications.  Assessment/Plan: Problem #1 acute kidney injury: Presently seems to be multifactorial. The etiology for acute kidney injury is most likely associated to dye-induced acute renal failure. At this moment ATN/nonsteroidal/ACE inhibitor/vancomycin we contribute to the etiology.Patient is none oliguric but no measured accurately as patient use the bath room a lot with out saving it. Her BUN and creatinine is continuesly improving. Problem #2 diabetes Problem #3 healthcare associated pneumonia: Patient denies any cough and no SOB . Over feels much better Problem #4 cardiomyopathy with low EF. Etiology not clear possibly viral Problem #5 anemia: her hemoglobin is stable Problem #6 hypertension: Her blood pressure is reasonably controlled.  Plan: We will continue with present treatment We'll check her basic  metabolic panel in the morning.   LOS: 6 days   Celestial Barnfield S 08/14/2014,10:31 AM

## 2014-08-14 NOTE — Progress Notes (Signed)
  Subjective:  Patient continues to complain of nausea and vomiting. She vomited earlier today even though she did not eat her breakfast. She states looking at food makes her sick. She denies heartburn dysphagia or abdominal pain. She has been passing flatness but hasn't had a bowel movement. She does not feel constipated. She denies shortness or breath. She's says cough has resolved. She states symptoms started about 8 or 9 days ago.   Objective: Blood pressure 113/74, pulse 110, temperature 98.2 F (36.8 C), temperature source Oral, resp. rate 20, height 5\' 2"  (1.575 m), weight 203 lb 3.2 oz (92.171 kg), last menstrual period 07/03/2014, SpO2 100.00%. Patient is alert and appears to be in no acute distress. Conjunctiva is pink. Sclera is nonicteric Oropharyngeal mucosa is normal. No neck masses or thyromegaly noted. Cardiac exam with regular rhythm normal S1 and S2. No murmur or gallop noted. Lungs are clear to auscultation. Abdomen bowel sounds are normal. Abdomen is soft and nontender without organomegaly or masses.  No LE edema or clubbing noted.  Labs/studies Results:   Recent Labs  08/13/14 0607  WBC 9.7  HGB 10.3*  HCT 30.0*  PLT 374    BMET   Recent Labs  08/12/14 0616 08/13/14 0607 08/14/14 0834  NA 139 141 136*  K 4.0 3.8 3.7  CL 103 104 100  CO2 25 24 21   GLUCOSE 110* 108* 178*  BUN 13 14 14   CREATININE 2.23* 2.14* 2.08*  CALCIUM 8.8 8.7 8.4    LFT   Recent Labs  08/13/14 0607  PROT 6.9  ALBUMIN 2.8*  AST 26  ALT 15  ALKPHOS 66  BILITOT 0.5  BILIDIR <0.2  IBILI NOT CALCULATED     Assessment:  #1. Nausea and vomiting presumed to be secondary to gastroparesis he is but she has not responded to IV metoclopromide which was started 2 days ago. While she could have severe gastroparesis or peptic ulcer disease her nausea and vomiting may be secondary to acute illness felt to be pneumonia. However therapy with antibiotics has not made any difference  in her GI symptoms. Firstly nausea and vomiting could be due to the antibiotics and it remains to be seen if she would improve in the next 24 hours. Renal dysfunction could also be contradicting to her nausea and vomiting. #2. Anemia possibly due to chronic disease. No evidence of GI bleed and H&H is stable. #3. Severe cardiomyopathy with EF of 20-25%. On exam she does not appear to be in CHF. #4. Poorly controlled diabetes mellitus. Hemoglobin A1c 6 days ago was 9.4. #5. Markedly elevated sedimentation rate.  ANA complement levels are normal. Abnormal serum protein electrophoresis but no M-spike noted. If elevated sedimentation rate was secondary to pneumonia would expect improvement now that she's been treated with antibiotics.  Recommendations;  Repeat sedimentation rate in the a.m. Continue IV PPI and metoclopromide. Patient will be reevaluated in the a.m.

## 2014-08-14 NOTE — Progress Notes (Signed)
[facility] PROGRESS NOTE  Diana [name] [id number] DOB: November 01, 1986 DOA: 08/08/2014 PCP: Pcp Not In System  Assessment/Plan: 1. Healthcare associated multilobar pneumonia. Normal respiratory effort.  She remains afebrile with no leukocytosis. She has completed a course of antibiotics. Strep pneumo antigen and Legionella antigen both negative. 2. Bilateral pleural effusions. These could be parapneumonic or transudative.  3. Small pericardial effusion per CT and confirmed with echo. ECG with sinus tach at rate of 101. Evaluated by Dr Harl Bowie with cardiology who recommended lasix and coreg. Will need close follow up with cardiology when discharged. 4. Type 2 diabetes mellitus. The patient was diagnosed with diabetes mellitus in 2005 and takes metformin at home. HgA1c 9.4. Continue to hold this. CBG range 104-131. Continue Levemir and continue SSI. Will monitor and determine discharge plan for DM when close to discharge, oral intake more reliable. Will clearly need close OP follow up for improved control.  5. Normocytic anemia. Stable. Iron 35, TIBC 207 RBC 3.06 otherwise anemia panel within the limits of normal. Consider iron supplement.  6. Acute systolic HF: elevated BNP echo with EF 20-25%. May be related to #1 and #2. Appears euvolemic at this time. Evaluated by cardiology and started on coreg.  He opines low likelihood of ischemic cardiomyopathy and therefore no plans for ischemic testing at this time.  7. Nausea/vomiting. Persistent. Has had continued nausea and vomiting. GI is following. Ultrasound of RUQ is unremarkable. Lipase and LFTs were normal. She was started on reglan for possible gastroparesis without significant improvement. She is also on antiemetics. Will await further work up from GI 8. Sinus tachycardia: likely related to #1 and #6. Continues to improve. No events on tele. Monitor\ 9. Acute kidney injury:  little improvement. Likely multifactorial i.e. Vancomycin induced,  contrast dye, diuresis. Appreciate nephrology input. IV fluids decreased and lasix resumed  per nephrology. Monitor 10. Trichomonas: in urine. Flagyl. Counseled to notify partners   Code Status: full Family Communication: none present Disposition Plan: home when ready   Consultants:  Cardiology  Nephrology  Gastroenterology  Procedures:  none  Antibiotics:  Flagyl 08/12/14 Vancomycin 08/08/14>> 08/10/14  Cefepime 08/08/14>> 08/11/14  Zithromax 08/08/14-08/09/14  levaquin 08/11/14>>08/13/14    HPI/Subjective: Laying in bed. Reports continued nausea and vomiting. Reports throwing up breakfast and lunch. No shortness of breath  Objective: Filed Vitals:   08/14/14 0558  BP: 113/74  Pulse: 110  Temp: 98.2 F (36.8 C)  Resp: 20    Intake/Output Summary (Last 24 hours) at 08/14/14 1257 Last data filed at 08/14/14 0900  Gross per 24 hour  Intake    350 ml  Output    550 ml  Net   -200 ml   Filed Weights   08/12/14 0521 08/13/14 0614 08/14/14 0558  Weight: 90.8 kg (200 lb 2.8 oz) 90.9 kg (200 lb 6.4 oz) 92.171 kg (203 lb 3.2 oz)    Exam:   General:  Obese appears comfortable  Cardiovascular: RRR No MGR No LE edema PPP  Respiratory: normal effort BS with good air flow no wheeze  Abdomen: obese soft +BS, tender in periumbilical area, bs+  Musculoskeletal: no clubbing or cyanosis   Data Reviewed: Basic Metabolic Panel:  Recent Labs Lab 08/10/14 0548 08/11/14 0630 08/12/14 0616 08/13/14 0607 08/14/14 0834  NA 138 139 139 141 136*  K 3.6* 3.8 4.0 3.8 3.7  CL 102 101 103 104 100  CO2 26 26 25 24 21   GLUCOSE 195* 133* 110* 108* 178*  BUN 11 11 13  14 14  CREATININE 1.70* 2.23* 2.23* 2.14* 2.08*  CALCIUM 8.6 8.7 8.8 8.7 8.4   Liver Function Tests:  Recent Labs Lab 08/09/14 0538 08/10/14 0839 08/13/14 0607  AST 12 12 26   ALT 9 7 15   ALKPHOS 71 65 66  BILITOT 0.3 0.2* 0.5  PROT 6.4 6.1 6.9  ALBUMIN 2.4* 2.3* 2.8*    Recent Labs Lab  08/09/14 1551 08/13/14 0607  LIPASE 42 45   No results found for this basename: AMMONIA,  in the last 168 hours CBC:  Recent Labs Lab 08/08/14 0416 08/09/14 0538 08/10/14 0548 08/11/14 0630 08/13/14 0607  WBC 8.9 6.6 7.9 8.2 9.7  NEUTROABS 6.9  --   --   --   --   HGB 10.0* 8.7* 8.9* 8.6* 10.3*  HCT 28.3* 25.5* 26.0* 25.4* 30.0*  MCV 84.5 85.0 85.2 84.9 84.5  PLT 312 307 316 319 374   Cardiac Enzymes:  Recent Labs Lab 08/08/14 0456 08/08/14 0857 08/09/14 1551  TROPONINI <0.30 <0.30 <0.30   BNP (last 3 results)  Recent Labs  08/08/14 0456  PROBNP 2629.0*   CBG:  Recent Labs Lab 08/13/14 1652 08/13/14 2041 08/14/14 0236 08/14/14 0736 08/14/14 1112  GLUCAP 102* 100* 127* 183* 159*    No results found for this or any previous visit (from the past 240 hour(s)).   Studies: Dg Abd 2 Views  08/12/2014   CLINICAL DATA:  Nausea, vomiting.  Abdominal pain.  EXAM: ABDOMEN - 2 VIEW  COMPARISON:  Abdominal radiograph 08/09/2014  FINDINGS: Leads overlie the patient. Small bilateral pleural effusions and heterogeneous opacities within the lung bases. Relative paucity of small bowel gas. No definite evidence for small bowel obstruction. Regional skeleton is unremarkable.  IMPRESSION: Paucity of small bowel gas.  No definite evidence for obstruction.  Bilateral pleural effusions and heterogeneous opacities within the bilateral lung bases.   Electronically Signed   By: Lovey Newcomer M.D.   On: 08/12/2014 19:18   US Abdomen Limited Ruq  08/13/2014   CLINICAL DATA:  Persistent nausea, vomiting  EXAM: US ABDOMEN LIMITED - RIGHT UPPER QUADRANT  COMPARISON:  None.  FINDINGS: Gallbladder:  No gallstones or wall thickening visualized. No sonographic Murphy sign noted.  Common bile duct:  Diameter: 3.2 mm  Liver:  No focal lesion identified. Within normal limits in parenchymal echogenicity.  IMPRESSION: Unremarkable right upper quadrant ultrasound.   Electronically Signed   By: Lahoma Crocker  M.D.   On: 08/13/2014 09:45    Scheduled Meds: . benzonatate  100 mg Oral TID  . carvedilol  6.25 mg Oral BID WC  . enoxaparin (LOVENOX) injection  40 mg Subcutaneous Q24H  . furosemide  60 mg Intravenous BID  . insulin aspart  0-15 Units Subcutaneous TID WC  . insulin aspart  0-5 Units Subcutaneous QHS  . insulin detemir  14 Units Subcutaneous QHS  . levofloxacin (LEVAQUIN) IV  750 mg Intravenous Q48H  . metoCLOPramide (REGLAN) injection  10 mg Intravenous 4 times per day  . ondansetron  4 mg Oral Q4H  . pantoprazole  40 mg Oral Daily   Continuous Infusions:    Principal Problem:   HCAP (healthcare-associated pneumonia) Active Problems:   Pleural effusion, bilateral   Pericardial effusion   Type II or unspecified type diabetes mellitus without mention of complication, not stated as uncontrolled   Anemia, unspecified   Nausea with vomiting   Acute systolic heart failure   Acute renal failure   Sinus tachycardia  Obesity, unspecified   Trichomonas infection    Time spent: 25 minutes    [facility] Pager [phone]. If 7PM-7AM, please contact night-coverage at [email or web address], password [facility] 08/14/2014, 12:57 PM  LOS: 6 days

## 2014-08-15 ENCOUNTER — Encounter (HOSPITAL_COMMUNITY): Payer: BC Managed Care – PPO

## 2014-08-15 LAB — BASIC METABOLIC PANEL
Anion gap: 10 (ref 5–15)
BUN: 13 mg/dL (ref 6–23)
CO2: 28 mEq/L (ref 19–32)
Calcium: 8.5 mg/dL (ref 8.4–10.5)
Chloride: 99 mEq/L (ref 96–112)
Creatinine, Ser: 2.28 mg/dL — ABNORMAL HIGH (ref 0.50–1.10)
GFR calc Af Amer: 33 mL/min — ABNORMAL LOW (ref >90)
GFR calc non Af Amer: 28 mL/min — ABNORMAL LOW (ref >90)
Glucose, Bld: 140 mg/dL — ABNORMAL HIGH (ref 70–99)
Potassium: 3.3 mEq/L — ABNORMAL LOW (ref 3.7–5.3)
Sodium: 137 mEq/L (ref 137–147)

## 2014-08-15 LAB — GLUCOSE, CAPILLARY
Glucose-Capillary: 168 mg/dL — ABNORMAL HIGH (ref 70–99)
Glucose-Capillary: 133 mg/dL — ABNORMAL HIGH (ref 70–99)
Glucose-Capillary: 160 mg/dL — ABNORMAL HIGH (ref 70–99)
Glucose-Capillary: 133 mg/dL — ABNORMAL HIGH (ref 70–99)
Glucose-Capillary: 119 mg/dL — ABNORMAL HIGH (ref 70–99)

## 2014-08-15 LAB — SEDIMENTATION RATE: Sed Rate: 64 mm/hr — ABNORMAL HIGH (ref 0–22)

## 2014-08-15 LAB — URINE CULTURE: Colony Count: 10000

## 2014-08-15 MED ORDER — POTASSIUM CHLORIDE 10 MEQ/100ML IV SOLN: 10.0000 meq | INTRAVENOUS | Status: DC

## 2014-08-15 MED ORDER — POTASSIUM CHLORIDE CRYS ER 20 MEQ PO TBCR
40.0000 meq | EXTENDED_RELEASE_TABLET | Freq: Once | ORAL | Status: DC
  Filled 2014-08-15: qty 2

## 2014-08-15 MED ORDER — POTASSIUM CHLORIDE 10 MEQ/100ML IV SOLN
10.0000 meq | INTRAVENOUS | Status: DC
  Administered 2014-08-15 (×2): 10 meq via INTRAVENOUS
  Filled 2014-08-15 (×4): qty 100

## 2014-08-15 MED ORDER — POTASSIUM CHLORIDE 20 MEQ/15ML (10%) PO LIQD
40.0000 meq | Freq: Once | ORAL | Status: DC
  Filled 2014-08-15: qty 30

## 2014-08-15 MED ORDER — SODIUM CHLORIDE 0.9 % IV SOLN
INTRAVENOUS | Status: DC
  Administered 2014-08-15: via INTRAVENOUS

## 2014-08-15 NOTE — Progress Notes (Signed)
Patient no longer has IV access.  Multiple attempts made for new IV.  Notified MD and received order for PICC.  Discussed PICC line with patient and importance of IV access.  Patient verbally agreed to received PICC.  Will continue to monitor patient.

## 2014-08-15 NOTE — Progress Notes (Signed)
Notified MD that patient is still vomiting after eating and drinking.  Will continue to monitor patient.

## 2014-08-15 NOTE — Progress Notes (Signed)
  Subjective:  Patient has no complaints. She denies nausea shortness of breath cough or chest pain. She feels her appetite is coming back and she was to eat fruits. She denies abdominal pain or diarrhea.    Objective: Blood pressure 124/80, pulse 100, temperature 98.1 F (36.7 C), temperature source Oral, resp. rate 20, height 5\' 2"  (1.575 m), weight 199 lb 3.2 oz (90.357 kg), last menstrual period 07/03/2014, SpO2 98.00%. Patient is alert and appears to be in no acute distress. Cardiac exam with regular rhythm normal S1 and S2. No murmur or gallop noted. Lungs are clear to auscultation. Abdomen is full with normal bowel sounds. It is soft and nontender without organomegaly or masses. No LE edema or clubbing noted.  Labs/studies Results:  Sedimentation rate is 64; was 105 four days ago   BMET   Recent Labs  08/13/14 0607 08/14/14 0834 08/15/14 0654  NA 141 136* 137  K 3.8 3.7 3.3*  CL 104 100 99  CO2 24 21 28   GLUCOSE 108* 178* 140*  BUN 14 14 13   CREATININE 2.14* 2.08* 2.28*  CALCIUM 8.7 8.4 8.5    Assessment;  #1. Nausea and vomiting. Patient is not nauseated anymore. She is hungry and would like to have fruit with her next meal. If she does not tolerate diet Will proceed with EGD in a.m.  #2. AKI. Serum creatinine is going off and Lasix has been discontinued. #3. Cardiomyopathy. Patient does not appear to be in CHF. #4. Anemia. No evidence of GI bleed. #5. Elevated sedimentation rate. Significant drop in sedimentation rate is reassuring and possibly related to therapy for pneumonia.  Recommendations;  Esophagogastroduodenoscopy in a.m. if patient unable to tolerate oral feeding.

## 2014-08-15 NOTE — Progress Notes (Signed)
Notified MD that IV potassium can not be given through a midline.  Will continue to monitor patient.

## 2014-08-15 NOTE — Progress Notes (Signed)
Pt. Refused PO potassium.  MD notified.

## 2014-08-15 NOTE — Progress Notes (Signed)
Notified Dr. Roderic Palau of inability to achieve PICC placement. Converted to Midline and recommended IR to do exchange if the need for PICC is necessary. Sign placed above pt's bed so as not to be confused with PICC and RN educated.

## 2014-08-15 NOTE — Progress Notes (Signed)
Encouraged patient to ambulate in hall but patient stated she would in 30 minutes.

## 2014-08-15 NOTE — Progress Notes (Signed)
[facility] PROGRESS NOTE  Diana [name] [id number] DOB: Jun 26, 1986 DOA: 08/08/2014 PCP: Pcp Not In System  Assessment/Plan: 1. Healthcare associated multilobar pneumonia. Normal respiratory effort.  She remains afebrile with no leukocytosis. She has completed a course of antibiotics. Strep pneumo antigen and Legionella antigen both negative. 2. Bilateral pleural effusions. These could be parapneumonic or transudative.  3. Small pericardial effusion per CT and confirmed with echo. ECG with sinus tach at rate of 101. Evaluated by Dr Harl Bowie with cardiology who recommended lasix and coreg. Will need close follow up with cardiology when discharged. 4. Type 2 diabetes mellitus. The patient was diagnosed with diabetes mellitus in 2005 and takes metformin at home. HgA1c 9.4. Continue to hold this. CBG range 104-131. Continue Levemir and continue SSI. Will monitor and determine discharge plan for DM when close to discharge, oral intake more reliable. Will clearly need close OP follow up for improved control.  5. Normocytic anemia. Stable. Iron 35, TIBC 207 RBC 3.06 otherwise anemia panel within the limits of normal. Consider iron supplement.  6. Acute systolic HF: elevated BNP echo with EF 20-25%. May be related to #1 and #2. Appears euvolemic at this time. Evaluated by cardiology and started on coreg.  He opines low likelihood of ischemic cardiomyopathy and therefore no plans for ischemic testing at this time.  7. Nausea/vomiting. Persistent. Has had continued nausea and vomiting. GI is following. Ultrasound of RUQ is unremarkable. Lipase and LFTs were normal. She was started on reglan for possible gastroparesis without significant improvement. She is also on antiemetics. She may report some improvement today.  If her symptoms continue, plan is for possible EGD tomorrow 8. Sinus tachycardia: likely related to #1 and #6. Continues to improve. No events on tele. Monitor 9. Acute kidney injury:   little improvement. Likely multifactorial i.e. Vancomycin induced, contrast dye, diuresis. Appreciate nephrology input. Lasix discontinued today. Continue to Monitor 10. Trichomonas: in urine. Treated with Flagyl. Counseled to notify partners   Code Status: full Family Communication: none present Disposition Plan: home when ready   Consultants:  Cardiology  Nephrology  Gastroenterology  Procedures:  none  Antibiotics:  Flagyl 08/12/14 x1 dose Vancomycin 08/08/14>> 08/10/14  Cefepime 08/08/14>> 08/11/14  Zithromax 08/08/14-08/09/14  levaquin 08/11/14>>08/13/14    HPI/Subjective: Reports that she vomited this morning.  She did not eat her breakfast. Feeling ok now.  Wants to eat fruit for lunch.  Objective: Filed Vitals:   08/15/14 0839  BP: 124/80  Pulse: 100  Temp:   Resp:     Intake/Output Summary (Last 24 hours) at 08/15/14 1154 Last data filed at 08/14/14 1900  Gross per 24 hour  Intake    120 ml  Output      4 ml  Net    116 ml   Filed Weights   08/13/14 0614 08/14/14 0558 08/15/14 0529  Weight: 90.9 kg (200 lb 6.4 oz) 92.171 kg (203 lb 3.2 oz) 90.357 kg (199 lb 3.2 oz)    Exam:   General:  Obese appears comfortable  Cardiovascular: RRR No MGR No LE edema PPP  Respiratory: normal effort BS with good air flow no wheeze  Abdomen: obese soft +BS, tender in periumbilical area, bs+  Musculoskeletal: no clubbing or cyanosis   Data Reviewed: Basic Metabolic Panel:  Recent Labs Lab 08/11/14 0630 08/12/14 0616 08/13/14 0607 08/14/14 0834 08/15/14 0654  NA 139 139 141 136* 137  K 3.8 4.0 3.8 3.7 3.3*  CL 101 103 104 100 99  CO2 26 25  24 21 28   GLUCOSE 133* 110* 108* 178* 140*  BUN 11 13 14 14 13   CREATININE 2.23* 2.23* 2.14* 2.08* 2.28*  CALCIUM 8.7 8.8 8.7 8.4 8.5   Liver Function Tests:  Recent Labs Lab 08/09/14 0538 08/10/14 0839 08/13/14 0607  AST 12 12 26   ALT 9 7 15   ALKPHOS 71 65 66  BILITOT 0.3 0.2* 0.5  PROT 6.4 6.1 6.9   ALBUMIN 2.4* 2.3* 2.8*    Recent Labs Lab 08/09/14 1551 08/13/14 0607  LIPASE 42 45   No results found for this basename: AMMONIA,  in the last 168 hours CBC:  Recent Labs Lab 08/09/14 0538 08/10/14 0548 08/11/14 0630 08/13/14 0607  WBC 6.6 7.9 8.2 9.7  HGB 8.7* 8.9* 8.6* 10.3*  HCT 25.5* 26.0* 25.4* 30.0*  MCV 85.0 85.2 84.9 84.5  PLT 307 316 319 374   Cardiac Enzymes:  Recent Labs Lab 08/09/14 1551  TROPONINI <0.30   BNP (last 3 results)  Recent Labs  08/08/14 0456  PROBNP 2629.0*   CBG:  Recent Labs Lab 08/14/14 1112 08/14/14 1611 08/14/14 1953 08/15/14 0111 08/15/14 0752  GLUCAP 159* 131* 152* 168* 133*    No results found for this or any previous visit (from the past 240 hour(s)).   Studies: No results found.  Scheduled Meds: . benzonatate  100 mg Oral TID  . carvedilol  6.25 mg Oral BID WC  . enoxaparin (LOVENOX) injection  40 mg Subcutaneous Q24H  . insulin aspart  0-15 Units Subcutaneous TID WC  . insulin aspart  0-5 Units Subcutaneous QHS  . insulin detemir  14 Units Subcutaneous QHS  . levofloxacin (LEVAQUIN) IV  750 mg Intravenous Q48H  . metoCLOPramide (REGLAN) injection  10 mg Intravenous 4 times per day  . ondansetron  4 mg Oral Q4H  . pantoprazole  40 mg Oral Daily  . potassium chloride  40 mEq Oral Once   Continuous Infusions:    Principal Problem:   HCAP (healthcare-associated pneumonia) Active Problems:   Pleural effusion, bilateral   Pericardial effusion   Type II or unspecified type diabetes mellitus without mention of complication, not stated as uncontrolled   Anemia, unspecified   Nausea with vomiting   Acute systolic heart failure   Acute renal failure   Sinus tachycardia   Obesity, unspecified   Trichomonas infection    Time spent: 25 minutes    [facility] Pager [phone]. If 7PM-7AM, please contact night-coverage at [email or web address], password [facility] 08/15/2014, 11:54 AM  LOS: 7  days

## 2014-08-15 NOTE — Progress Notes (Signed)
Encouraged patient to ambulate in all with assistance but patient stated that she did not want to and would in 30 minutes.  Will attempt to ambulate again.

## 2014-08-15 NOTE — Progress Notes (Signed)
Per Barnett Applebaum (PICC nurse), patient can not receive Potassium through midline.  Blood can be drawn from midline.  Barnett Applebaum is attempting to place peripheral IV so patient can receive potassium.  Will continue to monitor.

## 2014-08-15 NOTE — Progress Notes (Signed)
Peripherally Inserted Central Catheter/Midline Placement  The IV Nurse has discussed with the patient and/or persons authorized to consent for the patient, the purpose of this procedure and the potential benefits and risks involved with this procedure.  The benefits include less needle sticks, lab draws from the catheter and patient may be discharged home with the catheter.  Risks include, but not limited to, infection, bleeding, blood clot (thrombus formation), and puncture of an artery; nerve damage and irregular heat beat.  Alternatives to this procedure were also discussed.  PICC/Midline Placement Documentation  PICC / Midline Single Lumen 08/15/14 Midline Right Basilic 18 cm 0 cm (Active)  Indication for Insertion or Continuance of Line Poor Vasculature-patient has had multiple peripheral attempts or PIVs lasting less than 24 hours 08/15/2014  5:55 PM  Exposed Catheter (cm) 0 cm 08/15/2014  5:55 PM  Site Assessment Clean;Dry;Intact 08/15/2014  5:55 PM  Line Status Flushed;Capped (central line);Blood return noted 08/15/2014  5:55 PM  Dressing Type Transparent;Securing device 08/15/2014  5:55 PM  Dressing Status Clean;Dry;Intact;Antimicrobial disc in place 08/15/2014  5:55 PM  Line Care Connections checked and tightened 08/15/2014  5:55 PM  Dressing Intervention New dressing 08/15/2014  5:55 PM  Dressing Change Due 08/22/14 08/15/2014  5:55 PM   Converted to midline after PICC line would not drop into SVC after multiple interventions.    Charm Barges S 08/15/2014, 5:57 PM

## 2014-08-15 NOTE — Progress Notes (Signed)
Subjective: Interval History: Patient feels better. No nausea or vomiting. No difficulty in breathng  Objective: Vital signs in last 24 hours: Temp:  [98.1 F (36.7 C)-99.1 F (37.3 C)] 98.1 F (36.7 C) (09/27 0529) Pulse Rate:  [100-108] 100 (09/27 0839) Resp:  [18-20] 20 (09/27 0529) BP: (101-124)/(73-87) 124/80 mmHg (09/27 0839) SpO2:  [98 %-100 %] 98 % (09/27 0529) Weight:  [90.357 kg (199 lb 3.2 oz)] 90.357 kg (199 lb 3.2 oz) (09/27 0529) Weight change: -1.814 kg (-4 lb)  Intake/Output from previous day: 09/26 0701 - 09/27 0700 In: 200 [P.O.:200] Out: 8 [Urine:4; Emesis/NG output:4] Intake/Output this shift:    She si alert and in no apparent distress Chest decrease breath sound no rale or wheezing Heart : RRR no murmur Abdomen: Soft and none tender No edema  Lab Results:  Recent Labs  08/13/14 0607  WBC 9.7  HGB 10.3*  HCT 30.0*  PLT 374   BMET:   Recent Labs  08/14/14 0834 08/15/14 0654  NA 136* 137  K 3.7 3.3*  CL 100 99  CO2 21 28  GLUCOSE 178* 140*  BUN 14 13  CREATININE 2.08* 2.28*  CALCIUM 8.4 8.5   No results found for this basename: PTH,  in the last 72 hours Iron Studies:  No results found for this basename: IRON, TIBC, TRANSFERRIN, FERRITIN,  in the last 72 hours  Studies/Results: No results found.  I have reviewed the patient'[name] current medications.  Assessment/Plan: Problem #1 acute kidney injury: Her creatinine start increasing possible due to lasix Problem #2 diabetes Problem #3 healthcare associated pneumonia: Patient denies any cough and no SOB . Over feels much better Problem #4 cardiomyopathy with low EF. No sign of fluid overload Problem #5 anemia: her hemoglobin is stable Problem #6 hypertension: Her blood pressure is reasonably controlled.  Plan: d/c Lasix We'll check her basic metabolic panel in the morning.   LOS: 7 days   Diana [name] 08/15/2014,10:08 AM

## 2014-08-16 ENCOUNTER — Encounter (HOSPITAL_COMMUNITY): Payer: Self-pay

## 2014-08-16 ENCOUNTER — Encounter (HOSPITAL_COMMUNITY)
Admission: EM | Disposition: A | Discharge status: Home / Self Care | Payer: Self-pay | Source: Home / Self Care | Attending: Internal Medicine

## 2014-08-16 DIAGNOSIS — R1115 Cyclical vomiting syndrome unrelated to migraine: Secondary | ICD-10-CM

## 2014-08-16 HISTORY — PX: ESOPHAGOGASTRODUODENOSCOPY: SHX5428

## 2014-08-16 LAB — BASIC METABOLIC PANEL
Anion gap: 11 (ref 5–15)
BUN: 14 mg/dL (ref 6–23)
CO2: 30 mEq/L (ref 19–32)
Calcium: 8.5 mg/dL (ref 8.4–10.5)
Chloride: 99 mEq/L (ref 96–112)
Creatinine, Ser: 2.26 mg/dL — ABNORMAL HIGH (ref 0.50–1.10)
GFR calc Af Amer: 33 mL/min — ABNORMAL LOW (ref >90)
GFR calc non Af Amer: 29 mL/min — ABNORMAL LOW (ref >90)
Glucose, Bld: 146 mg/dL — ABNORMAL HIGH (ref 70–99)
Potassium: 3.7 mEq/L (ref 3.7–5.3)
Sodium: 140 mEq/L (ref 137–147)

## 2014-08-16 LAB — GLUCOSE, CAPILLARY
Glucose-Capillary: 136 mg/dL — ABNORMAL HIGH (ref 70–99)
Glucose-Capillary: 136 mg/dL — ABNORMAL HIGH (ref 70–99)
Glucose-Capillary: 118 mg/dL — ABNORMAL HIGH (ref 70–99)
Glucose-Capillary: 113 mg/dL — ABNORMAL HIGH (ref 70–99)
Glucose-Capillary: 126 mg/dL — ABNORMAL HIGH (ref 70–99)
Glucose-Capillary: 142 mg/dL — ABNORMAL HIGH (ref 70–99)

## 2014-08-16 LAB — UIFE/LIGHT CHAINS/TP QN, 24-HR UR
Albumin, U: DETECTED
Alpha 1, Urine: DETECTED — AB
Alpha 2, Urine: DETECTED — AB
Beta, Urine: DETECTED — AB
Gamma Globulin, Urine: DETECTED — AB
Time: 24 hours
Total Protein, Urine-Ur/day: 472 mg/d — ABNORMAL HIGH (ref <150)
Total Protein, Urine: 118 mg/dL — ABNORMAL HIGH (ref 5–24)
Volume, Urine: 400 mL

## 2014-08-16 SURGERY — EGD (ESOPHAGOGASTRODUODENOSCOPY)
Anesthesia: Moderate Sedation

## 2014-08-16 MED ORDER — MEPERIDINE HCL 100 MG/ML IJ SOLN
INTRAMUSCULAR | Status: DC | PRN
  Administered 2014-08-16: 50 mg via INTRAVENOUS

## 2014-08-16 MED ORDER — STERILE WATER FOR IRRIGATION IR SOLN
Status: DC | PRN
  Administered 2014-08-16: 16:00:00

## 2014-08-16 MED ORDER — LIDOCAINE VISCOUS 2 % MT SOLN
OROMUCOSAL | Status: DC | PRN
  Administered 2014-08-16: 3 mL via OROMUCOSAL

## 2014-08-16 MED ORDER — ONDANSETRON HCL 4 MG/2ML IJ SOLN
INTRAMUSCULAR | Status: DC | PRN
  Administered 2014-08-16: 4 mg via INTRAVENOUS

## 2014-08-16 MED ORDER — LIDOCAINE VISCOUS 2 % MT SOLN
OROMUCOSAL | Status: AC
  Filled 2014-08-16: qty 15

## 2014-08-16 MED ORDER — ONDANSETRON HCL 4 MG/2ML IJ SOLN
INTRAMUSCULAR | Status: AC
  Filled 2014-08-16: qty 2

## 2014-08-16 MED ORDER — PANTOPRAZOLE SODIUM 40 MG IV SOLR
40.0000 mg | Freq: Two times a day (BID) | INTRAVENOUS | Status: DC
  Administered 2014-08-16 – 2014-08-17 (×2): 40 mg via INTRAVENOUS
  Filled 2014-08-16 (×6): qty 40

## 2014-08-16 MED ORDER — MIDAZOLAM HCL 5 MG/5ML IJ SOLN
INTRAMUSCULAR | Status: AC
  Filled 2014-08-16: qty 10

## 2014-08-16 MED ORDER — MIDAZOLAM HCL 5 MG/5ML IJ SOLN
INTRAMUSCULAR | Status: DC | PRN
Start: 2014-08-16 — End: 2014-08-16
  Administered 2014-08-16 (×2): 2 mg via INTRAVENOUS

## 2014-08-16 MED ORDER — MEPERIDINE HCL 100 MG/ML IJ SOLN
INTRAMUSCULAR | Status: AC
  Filled 2014-08-16: qty 2

## 2014-08-16 NOTE — Progress Notes (Signed)
    Primary cardiologist: Dr. Carlyle Dolly  Subjective:   Sleepy this morning, not short of breath at rest. No chest pain.   Objective:   Temp:  [98.1 F (36.7 C)-98.7 F (37.1 C)] 98.2 F (36.8 C) (09/28 0601) Pulse Rate:  [97-108] 108 (09/28 0601) Resp:  [20] 20 (09/28 0601) BP: (113-142)/(57-97) 142/97 mmHg (09/28 0601) SpO2:  [99 %] 99 % (09/27 2257) Weight:  [197 lb 4.8 oz (89.495 kg)] 197 lb 4.8 oz (89.495 kg) (09/28 0601) Last BM Date: 08/12/14  Filed Weights   08/14/14 0558 08/15/14 0529 08/16/14 0601  Weight: 203 lb 3.2 oz (92.171 kg) 199 lb 3.2 oz (90.357 kg) 197 lb 4.8 oz (89.495 kg)    Intake/Output Summary (Last 24 hours) at 08/16/14 1010 Last data filed at 08/16/14 [id number]  Gross per 24 hour  Intake 517.33 ml  Output      0 ml  Net 517.33 ml    Telemetry: Sinus rhythm.  Exam:  General: Obese woman, no distress.  Lungs: Coarse breath sounds with scattered rhonchi. Decreased at bases.  Cardiac: Indistinct PMI, RRR.  Extremities: No pitting edema.   Lab Results:  Basic Metabolic Panel:  Recent Labs Lab 08/14/14 0834 08/15/14 0654 08/16/14 0626  NA 136* 137 140  K 3.7 3.3* 3.7  CL 100 99 99  CO2 21 28 30   GLUCOSE 178* 140* 146*  BUN 14 13 14   CREATININE 2.08* 2.28* 2.26*  CALCIUM 8.4 8.5 8.5    Liver Function Tests:  Recent Labs Lab 08/10/14 0839 08/13/14 0607  AST 12 26  ALT 7 15  ALKPHOS 65 66  BILITOT 0.2* 0.5  PROT 6.1 6.9  ALBUMIN 2.3* 2.8*    CBC:  Recent Labs Lab 08/10/14 0548 08/11/14 0630 08/13/14 0607  WBC 7.9 8.2 9.7  HGB 8.9* 8.6* 10.3*  HCT 26.0* 25.4* 30.0*  MCV 85.2 84.9 84.5  PLT 316 319 374     Medications:   Scheduled Medications: . benzonatate  100 mg Oral TID  . carvedilol  6.25 mg Oral BID WC  . insulin aspart  0-15 Units Subcutaneous TID WC  . insulin aspart  0-5 Units Subcutaneous QHS  . insulin detemir  14 Units Subcutaneous QHS  . levofloxacin (LEVAQUIN) IV  750 mg Intravenous Q48H    . metoCLOPramide (REGLAN) injection  10 mg Intravenous 4 times per day  . ondansetron  4 mg Oral Q4H  . pantoprazole  40 mg Oral Daily  . potassium chloride  40 mEq Oral Once     Infusions: . sodium chloride 75 mL/hr at 08/16/14 0927     PRN Medications:  acetaminophen, acetaminophen, alum & mag hydroxide-simeth, guaiFENesin-dextromethorphan, HYDROcodone-acetaminophen, levalbuterol, morphine injection, ondansetron (ZOFRAN) IV, ondansetron   Assessment:   1. 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 Currently on Corag, no standing diuretic. No ACE inhibitor with recent renal insufficiency which is still not normalized.  2. Acute renal insufficiency, followed by Dr. Lowanda Foster, current creatinine 2.2  3. Small pericardial effusion, likely inflammatory.  4. Type 2 diabetes mellitus, uncontrolled a hemoglobin A1c 9.4.   Plan/Discussion:    Continue Coreg. Depending on blood pressure trend, might consider starting hydralazine/nitrate combination since renal function has not normalized. Otherwise might wait until later and reintroduce ACE inhibitor or ARB. No standing diuretic for now.   Diana [name], M.D., F.A.C.C.

## 2014-08-16 NOTE — Op Note (Signed)
[facility] [address] [place], [address]   ENDOSCOPY PROCEDURE REPORT  PATIENT: Diana [name], Diana [name]  MR#: [id number] BIRTHDATE: 07/01/1986 , 28  yrs. old GENDER: female ENDOSCOPIST: R.  Garfield Cornea, MD FACP [facility] REFERRED BY:     hospitalist PROCEDURE DATE:  08/16/2014 PROCEDURE:  EGD, diagnostic INDICATIONS:  vomiting. MEDICATIONS: Versed 4 mg IV and Demerol 50 mg IV in divided doses. Xylocaine gel orally.  Zofran 4 mg IV ASA CLASS:      Class III  CONSENT: The risks, benefits, limitations, alternatives and imponderables have been discussed.  The potential for biopsy, esophogeal dilation, etc. have also been reviewed.  Questions have been answered.  All parties agreeable.  Please see the history and physical in the medical record for more information.  DESCRIPTION OF PROCEDURE: After the risks benefits and alternatives of the procedure were thoroughly explained, informed consent was obtained.  The [id number] endoscope was introduced through the mouth and advanced to the    , limited by Without limitations. The instrument was slowly withdrawn as the mucosa was fully examined.    Normal esophagus.  Stomach empty.  Normal-appearing gastric mucosa. Patent pylorus.  Normal first and second portion of the duodenum. Retroflexed views revealed no abnormalities.     The scope was then withdrawn from the patient and the procedure completed.  COMPLICATIONS: There were no immediate complications.  ENDOSCOPIC IMPRESSION: Normal esophagus.  Stomach empty.  Normal-appearing gastric mucosa. Patent pylorus.  Normal first and second portion of the duodenum. I continue to suspect her nausea and vomiting is secondary to non--GI systemic illness. She is tolerating Reglan well and seems to have improved some  in the past 24 hours.  I'm not sure she's been keeping down her Protonix as she's been getting it via the oral route  RECOMMENDATIONS: Clear liquid  diet.  Continue IV Reglan for now  Switch form oral Protonix to the IV route for the time being.  REPEAT EXAM:  eSigned:  R. Garfield Cornea, MD FACP [facility] 08/16/2014 4:18 PM    CC:  PATIENT NAME:  Diana [name], Diana [name] MR#: [id number]

## 2014-08-16 NOTE — Progress Notes (Signed)
ANTIBIOTIC CONSULT NOTE - follow up  Pharmacy Consult for Levaquin Indication: pneumonia  No Known Allergies  Patient Measurements: Height: 5\' 2"  (157.5 cm) Weight: 197 lb 4.8 oz (89.495 kg) IBW/kg (Calculated) : 50.1  Vital Signs: Temp: 98.2 F (36.8 C) (09/28 0601) Temp src: Oral (09/28 0601) BP: 142/97 mmHg (09/28 0601) Pulse Rate: 108 (09/28 0601) Intake/Output from previous day: 09/27 0701 - 09/28 0700 In: 637.3 [P.O.:360; I.V.:127.3; IV Piggyback:150] Out: -  Intake/Output from this shift:    Labs:  Recent Labs  08/14/14 0834 08/15/14 0654 08/16/14 0626  CREATININE 2.08* 2.28* 2.26*   Estimated Creatinine Clearance: 39.2 ml/min (by C-G formula based on Cr of 2.26).  Microbiology: Recent Results (from the past 720 hour(s))  URINE CULTURE     Status: None   Collection Time    08/12/14 10:00 PM      Result Value Ref Range Status   Specimen Description URINE, RANDOM   Final   Special Requests NONE   Final   Culture  Setup Time     Final   Value: 08/13/2014 13:24     Performed at [facility]     Final   Value: 10,000 COLONIES/ML     Performed at [facility]   Culture     Final   Value: YEAST     Performed at [facility]   Report Status 08/15/2014 FINAL   Final   Medical History: Past Medical History  Diagnosis Date  . Diabetes mellitus without complication   . HCAP (healthcare-associated pneumonia)   . Pleural effusion     bilateral  . Acute systolic heart failure   . Acute kidney injury   . Sinus tachycardia   . Obesity (BMI 30-39.9)     bmi 37.6   Assessment: 28 yo F being treated for healthcare associated multilobar pneumonia, rep pneumo antigen and Legionella antigen both negative. Persistent N/V.  Vancomycin 9/20>>9/22 Cefepime 9/20>>9/23 Zithromax 9/20 x 1 dose Levaquin 9/23 >>  Goal of Therapy:  Eradicate infection.  Plan:  Continue Levaquin 750mg  IV q48hrs (renally adjusted) Switch to  PO when improved / appropriate Monitor renal function and cx data   Pricilla Larsson 08/16/2014,11:46 AM

## 2014-08-16 NOTE — Progress Notes (Signed)
[facility] PROGRESS NOTE  Diana [name] [id number] DOB: 05-15-1986 DOA: 08/08/2014 PCP: Pcp Not In System  Assessment/Plan: Healthcare associated multilobar pneumonia. Normal respiratory effort. She remains afebrile with no leukocytosis. She has completed a course of antibiotics. Strep pneumo antigen and Legionella antigen both negative.   Bilateral pleural effusions. These could be parapneumonic or transudative.   Small pericardial effusion per CT and confirmed with echo. ECG with sinus tach at rate of 101. Evaluated by Dr Harl Bowie with cardiology who recommended lasix and coreg. Will need close follow up with cardiology when discharged.   Type 2 diabetes mellitus. The patient was diagnosed with diabetes mellitus in 2005 and takes metformin at home. HgA1c 9.4. Continue to hold this. CBG range 119-136 in setting of poor po intake secondary to persistent nausea/vomiting. Continue Levemir and continue SSI. Will monitor and determine discharge plan for DM when close to discharge, oral intake more reliable. Will clearly need close OP follow up for improved control.   Normocytic anemia. Stable. Iron 35, TIBC 207 RBC 3.06 otherwise anemia panel within the limits of normal. Consider iron supplement.   Acute systolic HF: elevated BNP echo with EF 20-25%. May be related to #1 and #2. Remains euvolemic at this time. Evaluated by cardiology and started on coreg. Cardiology opines low likelihood of ischemic cardiomyopathy and therefore no plans for ischemic testing at this time.  Nausea/vomiting. Persistent. Has had continued nausea and vomiting. GI is following. Ultrasound of RUQ is unremarkable. Lipase and LFTs were normal. She was started on reglan for possible gastroparesis without significant improvement. She is also on antiemetics. Plan for EGD this am.   Sinus tachycardia: likely related to #1 and #6. Stable range of 98-108. No events on tele. Monitor   Acute kidney injury: little  improvement. Likely multifactorial i.e. Vancomycin induced, contrast dye, diuresis. Appreciate nephrology input. Continue IV fluids. Continue to Monitor Trichomonas: in urine. Treated with Flagyl. Counseled to notify partners   Code Status: full Family Communication: none present Disposition Plan: home when ready   Consultants:  Cardiology  Nephrology  gastroenterology  Procedures:  none  Antibiotics: Flagyl 08/12/14 x1 dose Vancomycin 08/08/14>> 08/10/14  Cefepime 08/08/14>> 08/11/14  Zithromax 08/08/14-08/09/14  levaquin 08/11/14>>08/13/14     HPI/Subjective: Awake alert but un-engaging. Reports nausea and abdominal pain lower quadrants  Objective: Filed Vitals:   08/16/14 0601  BP: 142/97  Pulse: 108  Temp: 98.2 F (36.8 C)  Resp: 20    Intake/Output Summary (Last 24 hours) at 08/16/14 1030 Last data filed at 08/16/14 [id number]  Gross per 24 hour  Intake 517.33 ml  Output      0 ml  Net 517.33 ml   Filed Weights   08/14/14 0558 08/15/14 0529 08/16/14 0601  Weight: 92.171 kg (203 lb 3.2 oz) 90.357 kg (199 lb 3.2 oz) 89.495 kg (197 lb 4.8 oz)    Exam:   General:  Obese no acute distress  Cardiovascular: RRR No MGR No LE edema  Respiratory: normal effort BS clear bilaterally no wheeze or rhonchi  Abdomen: obese soft +BS non-tender to palaption  Musculoskeletal: joints without swelling/erythema   Data Reviewed: Basic Metabolic Panel:  Recent Labs Lab 08/12/14 0616 08/13/14 0607 08/14/14 0834 08/15/14 0654 08/16/14 0626  NA 139 141 136* 137 140  K 4.0 3.8 3.7 3.3* 3.7  CL 103 104 100 99 99  CO2 25 24 21 28 30   GLUCOSE 110* 108* 178* 140* 146*  BUN 13 14 14 13 14   CREATININE 2.23* 2.14*  2.08* 2.28* 2.26*  CALCIUM 8.8 8.7 8.4 8.5 8.5   Liver Function Tests:  Recent Labs Lab 08/10/14 0839 08/13/14 0607  AST 12 26  ALT 7 15  ALKPHOS 65 66  BILITOT 0.2* 0.5  PROT 6.1 6.9  ALBUMIN 2.3* 2.8*    Recent Labs Lab 08/09/14 1551  08/13/14 0607  LIPASE 42 45   No results found for this basename: AMMONIA,  in the last 168 hours CBC:  Recent Labs Lab 08/10/14 0548 08/11/14 0630 08/13/14 0607  WBC 7.9 8.2 9.7  HGB 8.9* 8.6* 10.3*  HCT 26.0* 25.4* 30.0*  MCV 85.2 84.9 84.5  PLT 316 319 374   Cardiac Enzymes:  Recent Labs Lab 08/09/14 1551  TROPONINI <0.30   BNP (last 3 results)  Recent Labs  08/08/14 0456  PROBNP 2629.0*   CBG:  Recent Labs Lab 08/15/14 1156 08/15/14 1651 08/15/14 2110 08/16/14 0731 08/16/14 0930  GLUCAP 160* 133* 119* 136* 136*    Recent Results (from the past 240 hour(s))  URINE CULTURE     Status: None   Collection Time    08/12/14 10:00 PM      Result Value Ref Range Status   Specimen Description URINE, RANDOM   Final   Special Requests NONE   Final   Culture  Setup Time     Final   Value: 08/13/2014 13:24     Performed at [place]     Final   Value: 10,000 COLONIES/ML     Performed at [facility]   Culture     Final   Value: YEAST     Performed at [facility]   Report Status 08/15/2014 FINAL   Final     Studies: No results found.  Scheduled Meds: . benzonatate  100 mg Oral TID  . carvedilol  6.25 mg Oral BID WC  . insulin aspart  0-15 Units Subcutaneous TID WC  . insulin aspart  0-5 Units Subcutaneous QHS  . insulin detemir  14 Units Subcutaneous QHS  . levofloxacin (LEVAQUIN) IV  750 mg Intravenous Q48H  . metoCLOPramide (REGLAN) injection  10 mg Intravenous 4 times per day  . ondansetron  4 mg Oral Q4H  . pantoprazole  40 mg Oral Daily  . potassium chloride  40 mEq Oral Once   Continuous Infusions: . sodium chloride 75 mL/hr at 08/16/14 [id number]    Principal Problem:   HCAP (healthcare-associated pneumonia) Active Problems:   Pleural effusion, bilateral   Pericardial effusion   Type II or unspecified type diabetes mellitus without mention of complication, not stated as uncontrolled   Anemia,  unspecified   Nausea with vomiting   Acute systolic heart failure   Acute renal failure   Sinus tachycardia   Obesity, unspecified   Trichomonas infection    Time spent: 35 mintutes    [facility] Pager [phone]. If 7PM-7AM, please contact night-coverage at [email or web address], password [facility] 08/16/2014, 10:30 AM  LOS: 8 days

## 2014-08-16 NOTE — Progress Notes (Signed)
Subjective: Interval History: No new complaint-. No difficulty in breathing  Objective: Vital signs in last 24 hours: Temp:  [98.1 F (36.7 C)-98.7 F (37.1 C)] 98.2 F (36.8 C) (09/28 0601) Pulse Rate:  [97-108] 108 (09/28 0601) Resp:  [20] 20 (09/28 0601) BP: (113-142)/(57-97) 142/97 mmHg (09/28 0601) SpO2:  [99 %] 99 % (09/27 2257) Weight:  [89.495 kg (197 lb 4.8 oz)] 89.495 kg (197 lb 4.8 oz) (09/28 0601) Weight change: -0.862 kg (-1 lb 14.4 oz)  Intake/Output from previous day: 09/27 0701 - 09/28 0700 In: 637.3 [P.O.:360; I.V.:127.3; IV Piggyback:150] Out: -  Intake/Output this shift:    She si alert and in no apparent distress Chest decrease breath sound no rale or wheezing Heart : RRR no murmur Abdomen: Soft and none tender No edema  Lab Results: No results found for this basename: WBC, HGB, HCT, PLT,  in the last 72 hours BMET:   Recent Labs  08/15/14 0654 08/16/14 0626  NA 137 140  K 3.3* 3.7  CL 99 99  CO2 28 30  GLUCOSE 140* 146*  BUN 13 14  CREATININE 2.28* 2.26*  CALCIUM 8.5 8.5   No results found for this basename: PTH,  in the last 72 hours Iron Studies:  No results found for this basename: IRON, TIBC, TRANSFERRIN, FERRITIN,  in the last 72 hours  Studies/Results: No results found.  I have reviewed the patient's current medications.  Assessment/Plan: Problem #1 acute kidney injury: Her renal function is better Problem #2 diabetes Problem #3 healthcare associated pneumonia: Patient is getting better and she is asymptomatic Problem #4 cardiomyopathy with low EF. No sign of fluid overload Problem #5 anemia: her hemoglobin is stable Problem #6 hypertension: Her blood pressure is reasonably controlled.  Problem#7 Hypokalemia: Her potassium has corrected Plan: ns at 75 cc/hr We'll check her basic metabolic panel in the morning.   LOS: 8 days   Naseer Hearn S 08/16/2014,8:02 AM

## 2014-08-16 NOTE — Progress Notes (Signed)
Patient seen and examined. No reviewed.  She continued to have nausea and vomiting and underwent EGD today. She is seen in her room post EGD. she reports that she is better. Wants to try some liquids. Appreciate GI assistance. She does not appear short of breath. Her creatinine appears to be stable. Plans are to discharge patient home once she is able to tolerate by mouth  MEMON,JEHANZEB

## 2014-08-16 NOTE — Progress Notes (Signed)
Pt encouraged to ambulate in hall with assistance. Pt stated she would ambulate after returning to room following EGD.

## 2014-08-16 NOTE — Interval H&P Note (Signed)
History and Physical Interval Note:  08/16/2014 3:54 PM  Diana [name]  has presented today for surgery, with the diagnosis of N/V  The various methods of treatment have been discussed with the patient and family. After consideration of risks, benefits and other options for treatment, the patient has consented to  Procedure(s): ESOPHAGOGASTRODUODENOSCOPY (EGD) (N/A) as a surgical intervention .  The patient's history has been reviewed, patient examined, no change in status, stable for surgery.  I have reviewed the patient's chart and labs.  Questions were answered to the patient's satisfaction.    Continued nausea and vomiting over the weekend although she states it has improved over the past 24 hours. Diagnostic EGD now being performed.The risks, benefits, limitations, alternatives and imponderables have been reviewed with the patient. Potential for esophageal dilation, biopsy, etc. have also been reviewed.  Questions have been answered. All parties agreeable.  Manus Rudd

## 2014-08-16 NOTE — H&P (View-Only) (Signed)
Referring Provider: No ref. provider found Primary Care Physician:  Pcp Not In System Primary Gastroenterologist:  Dr.Rourk  Reason for Consultation:  Nausea and vomiting  HPI: 28 year old African American lady admitted to the hospital September 20 with multi-lobar infiltrates, cardiomyopathy, congestive heart failure, acute renal failure along with nausea and vomiting. Hospital course has been complicated by acute renal failure. She has had some vague abdominal pain but according to the nursing staff and the medical record, has not required any narcotics so during this admission. Patient states she started having cough and dyspnea 2 weeks ago and that's when she started having vague nausea and vomiting. Patient does admit to taking occasional naproxen at home. Denies alcohol.  At least a 7 year history of diabetes. Patient says she doesn't "look after her diabetes".  Hemoglobin A1c 9.4. Nonobstructive bowel gas pattern on plain films. Right upper quadrant ultrasound reveals gallbladder in situ; no evidence of biliary obstruction or cholecystitis. Serum lipase and LFTs have been normal.   She is anemic with a mixed iron deficiency picture.   IV Reglan added to PPI therapy just today. Patient denies any chronic GI symptoms illnesses. She denies any chronic symptoms of reflux, abdominal pain dysphagia or odynophagia. No history of melena or hematochezia. She denies constipation or diarrhea. Nephrology and cardiology are seeing in consultation as well.   Past Medical History  Diagnosis Date  . Diabetes mellitus without complication   . HCAP (healthcare-associated pneumonia)   . Pleural effusion     bilateral  . Acute systolic heart failure   . Acute kidney injury   . Sinus tachycardia   . Obesity (BMI 30-39.9)     bmi 37.6    Past Surgical History  Procedure Laterality Date  . Appendectomy      Prior to Admission medications   Medication Sig Start Date End Date Taking? Authorizing  Provider  acetaminophen (TYLENOL) 500 MG tablet Take 1,000 mg by mouth daily as needed for moderate pain.   Yes Historical Provider, MD  albuterol (PROAIR HFA) 108 (90 BASE) MCG/ACT inhaler Inhale 2 puffs into the lungs daily as needed for wheezing or shortness of breath.  08/06/14 08/13/14 Yes Historical Provider, MD  metFORMIN (GLUCOPHAGE) 500 MG tablet Take 500 mg by mouth daily.   Yes Historical Provider, MD  naproxen sodium (ANAPROX) 220 MG tablet Take 220 mg by mouth 2 (two) times daily as needed (pain).   Yes Historical Provider, MD    Current Facility-Administered Medications  Medication Dose Route Frequency Provider Last Rate Last Dose  . acetaminophen (TYLENOL) tablet 650 mg  650 mg Oral Q6H PRN Rexene Alberts, MD       Or  . acetaminophen (TYLENOL) suppository 650 mg  650 mg Rectal Q6H PRN Rexene Alberts, MD      . alum & mag hydroxide-simeth (MAALOX/MYLANTA) 200-200-20 MG/5ML suspension 30 mL  30 mL Oral Q6H PRN Rexene Alberts, MD      . benzonatate (TESSALON) capsule 100 mg  100 mg Oral TID Rexene Alberts, MD   100 mg at 08/09/14 1511  . carvedilol (COREG) tablet 6.25 mg  6.25 mg Oral BID WC Arnoldo Lenis, MD   6.25 mg at 08/13/14 0855  . enoxaparin (LOVENOX) injection 40 mg  40 mg Subcutaneous Q24H Rexene Alberts, MD   40 mg at 08/13/14 0854  . furosemide (LASIX) injection 60 mg  60 mg Intravenous BID Harriett Sine, MD      . guaiFENesin-dextromethorphan (ROBITUSSIN DM) 100-10  MG/5ML syrup 5 mL  5 mL Oral Q4H PRN Rexene Alberts, MD      . HYDROcodone-acetaminophen (NORCO/VICODIN) 5-325 MG per tablet 1-2 tablet  1-2 tablet Oral Q4H PRN Rexene Alberts, MD      . insulin aspart (novoLOG) injection 0-15 Units  0-15 Units Subcutaneous TID WC Rexene Alberts, MD   2 Units at 08/13/14 [phone]  . insulin aspart (novoLOG) injection 0-5 Units  0-5 Units Subcutaneous QHS Rexene Alberts, MD   2 Units at 08/09/14 2219  . insulin detemir (LEVEMIR) injection 14 Units  14 Units Subcutaneous QHS Radene Gunning, NP   14 Units at 08/12/14 2247  . levalbuterol (XOPENEX) nebulizer solution 0.63 mg  0.63 mg Nebulization TID Leida Lauth, RRT   0.63 mg at 08/12/14 0720  . levofloxacin (LEVAQUIN) IVPB 750 mg  750 mg Intravenous Q48H Lezlie Octave Black, NP   750 mg at 08/13/14 1227  . metoCLOPramide (REGLAN) injection 10 mg  10 mg Intravenous 4 times per day Kathie Dike, MD   10 mg at 08/13/14 1218  . morphine 2 MG/ML injection 2 mg  2 mg Intravenous Q4H PRN Rexene Alberts, MD      . ondansetron [facility]) tablet 4 mg  4 mg Oral Q6H PRN Rexene Alberts, MD       Or  . ondansetron [facility]) injection 4 mg  4 mg Intravenous Q6H PRN Rexene Alberts, MD   4 mg at 08/12/14 2144  . ondansetron (ZOFRAN) tablet 4 mg  4 mg Oral Q4H Lezlie Octave Black, NP      . pantoprazole (PROTONIX) EC tablet 40 mg  40 mg Oral Daily Kathie Dike, MD   40 mg at 08/13/14 0956    Allergies as of 08/08/2014  . (No Known Allergies)    History reviewed. No pertinent family history.  History   Social History  . Marital Status: Single    Spouse Name: N/A    Number of Children: N/A  . Years of Education: N/A   Occupational History  . Not on file.   Social History Main Topics  . Smoking status: Never Smoker   . Smokeless tobacco: Not on file  . Alcohol Use: No  . Drug Use: No  . Sexual Activity: No   Other Topics Concern  . Not on file   Social History Narrative  . No narrative on file    Review of Systems: GI: Denies vomiting blood, jaundice, and fecal incontinence.   Denies dysphagia or odynophagia. Derm: Denies rash, itching, dry skin, hives, moles, warts, or unhealing ulcers.  Psych: Denies depression, anxiety, memory loss, suicidal ideation, hallucinations, paranoia, and confusion. Heme: Denies bruising, bleeding, and enlarged lymph nodes.   Physical Exam: Vital signs in last 24 hours: Temp:  [98.4 F (36.9 C)] 98.4 F (36.9 C) (09/25 [id number]) Pulse Rate:  [100-103] 103 (09/25 0757) Resp:  [20] 20  (09/25 0614) BP: (126-135)/(84-88) 135/88 mmHg (09/25 0614) SpO2:  [97 %-100 %] 97 % (09/25 1448) Weight:  [200 lb 6.4 oz (90.9 kg)] 200 lb 6.4 oz (90.9 kg) (09/25 0614) Last BM Date: 08/09/14 General:   Appears not to feel well. No eye contact with me. Answers questions but otherwise not very conversant. Intermittently heaving during the encounter.  Head:  Normocephalic and atraumatic. Eyes:  Sclera clear, no icterus.   Conjunctiva pink. Ears:  Normal auditory acuity. Nose:  No deformity, discharge,  or lesions. Mouth:  No deformity or lesions, dentition normal. Neck:  Supple; no  masses or thyromegaly. Lungs:  Poor inspiratory effort when heaving.   Heart:  Regular rate and rhythm; no murmurs, clicks, rubs,  or gallops. Abdomen:  Nondistended. Obese. Bowel sounds inactive. No succussion splash. Nontender to palpation. No appreciable mass or hepatosplenomegaly.  .  Intake/Output from previous day: 08/23/23 0701 - 09/25 0700 In: 1081.3 [I.V.:1081.3] Out: 400 [Urine:400] Intake/Output this shift:    Lab Results:  Recent Labs  08/11/14 0630 08/13/14 0607  WBC 8.2 9.7  HGB 8.6* 10.3*  HCT 25.4* 30.0*  PLT 319 374   BMET  Recent Labs  08/11/14 0630 08-22-2014 0616 08/13/14 0607  NA 139 139 141  K 3.8 4.0 3.8  CL 101 103 104  CO2 26 25 24   GLUCOSE 133* 110* 108*  BUN 11 13 14   CREATININE 2.23* 2.23* 2.14*  CALCIUM 8.7 8.8 8.7   LFT  Recent Labs  08/13/14 0607  PROT 6.9  ALBUMIN 2.8*  AST 26  ALT 15  ALKPHOS 66  BILITOT 0.5  BILIDIR <0.2  IBILI NOT CALCULATED   PT/INR No results found for this basename: LABPROT, INR,  in the last 72 hours Hepatitis Panel  Recent Labs  08/11/14 1119  HEPBSAG NEGATIVE  HCVAB NEGATIVE  HEPAIGM NON REACTIVE  HEPBIGM NON REACTIVE   C-Diff No results found for this basename: CDIFFTOX,  in the last 72 hours  Studies/Results: Dg Abd 2 Views  2014/08/22   CLINICAL DATA:  Nausea, vomiting.  Abdominal pain.  EXAM: ABDOMEN -  2 VIEW  COMPARISON:  Abdominal radiograph 08/09/2014  FINDINGS: Leads overlie the patient. Small bilateral pleural effusions and heterogeneous opacities within the lung bases. Relative paucity of small bowel gas. No definite evidence for small bowel obstruction. Regional skeleton is unremarkable.  IMPRESSION: Paucity of small bowel gas.  No definite evidence for obstruction.  Bilateral pleural effusions and heterogeneous opacities within the bilateral lung bases.   Electronically Signed   By: Lovey Newcomer M.D.   On: 08-22-14 19:18   US Abdomen Limited Ruq  08/13/2014   CLINICAL DATA:  Persistent nausea, vomiting  EXAM: US ABDOMEN LIMITED - RIGHT UPPER QUADRANT  COMPARISON:  None.  FINDINGS: Gallbladder:  No gallstones or wall thickening visualized. No sonographic Murphy sign noted.  Common bile duct:  Diameter: 3.2 mm  Liver:  No focal lesion identified. Within normal limits in parenchymal echogenicity.  IMPRESSION: Unremarkable right upper quadrant ultrasound.   Electronically Signed   By: Lahoma Crocker M.D.   On: 08/13/2014 09:45    Impression:   28 year old lady in the hospital with dyspnea and pulmonary infiltrates, chest CT shows pleural  and  pericardial effusion. Atypical presentation for pneumonia. 2 week history of insidiously progressive nausea and more recently vomiting in the setting of heart failure, acute renal failure and poorly controlled diabetes mellitus.  As discussed with Dr. Roderic Palau, she may or may not have a pulmonary infection.  Premorbidly, she reports no GI symptoms. Ultrasound, LFTs and serum lipase negative or normal. She was started on Reglan last evening with some improvement in her nausea over the past 12 hours or so.  I suspect her nausea and vomiting are more likely "bystander" symptoms related to multisystem illness superimposed on poorly controlled diabetes. Medication effect remains a potential contributing factor. No evidence of bowel obstruction or biliary tract disease.  This would be an atypical presentation for peptic ulcer disease. There is reported chronic NSAID use, however.  Recommendations:   Agree with empiric trial of IV Reglan along  with continued acid suppression therapy. She does not seem to have any side effects related to Reglan at this time.  Better glycemic control going forward will be very important in her overall management. She will be reassessed tomorrow by Dr. Laural Golden. There is no need for a diagnostic EGD at this time.  However,  recommendations could change in the near future depending on her clinical course.  I have discussed my findings and recommendations with Dr. Ulice Bold via telephone this evening. I'd like to thank Dr. Freddy Jaksch for asking me to see this lady this afternoon.         Notice:  This dictation was prepared with Dragon dictation along with smaller phrase technology. Any transcriptional errors that result from this process are unintentional and may not be corrected upon review.

## 2014-08-16 NOTE — Progress Notes (Signed)
Late entry for 08/15/14, 2030. Patient refused her one time dose of PO Potassium. She took a small sip and stated "I don't want anymore."  The patient then started vomiting. Gave antinausea med, and continued to monitor the patient.

## 2014-08-17 ENCOUNTER — Inpatient Hospital Stay (HOSPITAL_COMMUNITY): Payer: BC Managed Care – PPO

## 2014-08-17 ENCOUNTER — Encounter (HOSPITAL_COMMUNITY): Payer: Self-pay | Admitting: Internal Medicine

## 2014-08-17 LAB — BASIC METABOLIC PANEL
Anion gap: 10 (ref 5–15)
BUN: 14 mg/dL (ref 6–23)
CO2: 29 mEq/L (ref 19–32)
Calcium: 8 mg/dL — ABNORMAL LOW (ref 8.4–10.5)
Chloride: 99 mEq/L (ref 96–112)
Creatinine, Ser: 2.07 mg/dL — ABNORMAL HIGH (ref 0.50–1.10)
GFR calc Af Amer: 37 mL/min — ABNORMAL LOW (ref >90)
GFR calc non Af Amer: 32 mL/min — ABNORMAL LOW (ref >90)
Glucose, Bld: 150 mg/dL — ABNORMAL HIGH (ref 70–99)
Potassium: 3.1 mEq/L — ABNORMAL LOW (ref 3.7–5.3)
Sodium: 138 mEq/L (ref 137–147)

## 2014-08-17 LAB — GLUCOSE, CAPILLARY
Glucose-Capillary: 137 mg/dL — ABNORMAL HIGH (ref 70–99)
Glucose-Capillary: 116 mg/dL — ABNORMAL HIGH (ref 70–99)
Glucose-Capillary: 126 mg/dL — ABNORMAL HIGH (ref 70–99)
Glucose-Capillary: 147 mg/dL — ABNORMAL HIGH (ref 70–99)

## 2014-08-17 LAB — MAGNESIUM: Magnesium: 1.5 mg/dL (ref 1.5–2.5)

## 2014-08-17 MED ORDER — METOCLOPRAMIDE HCL 5 MG/ML IJ SOLN
10.0000 mg | Freq: Three times a day (TID) | INTRAMUSCULAR | Status: DC
  Administered 2014-08-17 – 2014-08-19 (×8): 10 mg via INTRAVENOUS
  Filled 2014-08-17 (×8): qty 2

## 2014-08-17 MED ORDER — POTASSIUM CHLORIDE 10 MEQ/100ML IV SOLN: 10.0000 meq | INTRAVENOUS | Status: DC

## 2014-08-17 MED ORDER — ONDANSETRON HCL 4 MG PO TABS
4.0000 mg | ORAL_TABLET | Freq: Three times a day (TID) | ORAL | Status: DC
Start: 2014-08-17 — End: 2014-08-17

## 2014-08-17 MED ORDER — ONDANSETRON HCL 4 MG/2ML IJ SOLN
4.0000 mg | Freq: Three times a day (TID) | INTRAMUSCULAR | Status: DC
  Filled 2014-08-17: qty 2

## 2014-08-17 MED ORDER — IOHEXOL 300 MG/ML  SOLN: 50.0000 mL | Freq: Once | INTRAMUSCULAR | Status: AC | PRN

## 2014-08-17 MED ORDER — ONDANSETRON HCL 4 MG/2ML IJ SOLN
4.0000 mg | Freq: Three times a day (TID) | INTRAMUSCULAR | Status: DC
  Administered 2014-08-17 – 2014-08-18 (×3): 4 mg via INTRAVENOUS
  Filled 2014-08-17 (×2): qty 2

## 2014-08-17 MED ORDER — PANTOPRAZOLE SODIUM 40 MG IV SOLR
40.0000 mg | Freq: Two times a day (BID) | INTRAVENOUS | Status: DC
Start: 2014-08-17 — End: 2014-08-19
  Administered 2014-08-17 – 2014-08-19 (×4): 40 mg via INTRAVENOUS
  Filled 2014-08-17 (×4): qty 40

## 2014-08-17 MED ORDER — POTASSIUM CHLORIDE CRYS ER 20 MEQ PO TBCR
40.0000 meq | EXTENDED_RELEASE_TABLET | ORAL | Status: AC
  Filled 2014-08-17: qty 2

## 2014-08-17 NOTE — Progress Notes (Signed)
Diana [name]  MRN: [id number]  DOB/AGE: 14-Jul-1986 28 y.o.  Primary Care Physician:Pcp Not In System  Admit date: 08/08/2014  Chief Complaint:  Chief Complaint  Patient presents with  . Cough    [name]-Pt presented on  08/08/2014 with  Chief Complaint  Patient presents with  . Cough  .    Pt today feels better but says she still has nausea.  Meds . benzonatate  100 mg Oral TID  . carvedilol  6.25 mg Oral BID WC  . insulin aspart  0-15 Units Subcutaneous TID WC  . insulin aspart  0-5 Units Subcutaneous QHS  . insulin detemir  14 Units Subcutaneous QHS  . metoCLOPramide (REGLAN) injection  10 mg Intravenous 4 times per day  . ondansetron  4 mg Oral Q4H  . pantoprazole (PROTONIX) IV  40 mg Intravenous Q12H  . potassium chloride  40 mEq Oral Once     Physical Exam: Vital signs in last 24 hours: Temp:  [97.9 F (36.6 C)-98.4 F (36.9 C)] 97.9 F (36.6 C) (09/29 0659) Pulse Rate:  [97-111] 98 (09/29 0659) Resp:  [15-27] 20 (09/29 0659) BP: (114-157)/(78-117) 116/82 mmHg (09/29 0659) SpO2:  [94 %-100 %] 98 % (09/29 0659) Weight change:  Last BM Date: 08/14/14  Intake/Output from previous day: 09/28 0701 - 09/29 0700 In: 120 [P.O.:120] Out: -      Physical Exam: General- pt is awake,alert, oriented to time place and person Resp- No acute REsp distress, CTA B/L NO Rhonchi CVS- S1S2 regular in rate and rhythm GIT- BS+, soft, NT, ND EXT- NO LE Edema, Cyanosis   Lab Results:  Results for Diana, [name] (MRN [id number]) as of 08/17/2014 08:52  Ref. Range 08/13/2014 06:07  Hemoglobin Latest Range: 12.0-15.0 g/dL 10.3 (L)     BMET  Recent Labs  08/16/14 0626 08/17/14 0653  NA 140 138  K 3.7 3.1*  CL 99 99  CO2 30 29  GLUCOSE 146* 150*  BUN 14 14  CREATININE 2.26* 2.07*  CALCIUM 8.5 8.0*   Trend Creat  2015 0.79=>1.03=>1.7=>2.36=>2.0    MICRO Recent Results (from the past 240 hour([name]))  URINE CULTURE     Status: None   Collection Time   08/12/14 10:00 PM      Result Value Ref Range Status   Specimen Description URINE, RANDOM   Final   Special Requests NONE   Final   Culture  Setup Time     Final   Value: 08/13/2014 13:24     Performed at [place]     Final   Value: 10,000 COLONIES/ML     Performed at [facility]   Culture     Final   Value: YEAST     Performed at [facility]   Report Status 08/15/2014 FINAL   Final      Lab Results  Component Value Date   CALCIUM 8.0* 08/17/2014           Impression: 1)Renal AKI secondary to Multiple factors  Sepsis- admitted with Pneumonia  NSAIDS- on Naproxen as oputpt  Hypotension- Bp on lower side  ACE- now dc.ed  Cardiorenal Ef on lower side  Vanco - trough was high  IV dye   Auto immune workup negative  Most Likley ATN AKi now improving   CKD possible as pt has hx of DM and Proteinuria   2)HTN  BP at goal On Alpha and beta blockers  3)Anemia HGb Not at goal (  9--11)   4)CKD Mineral-Bone Disorder- not indicated to check in AKI    5)ID- admitted with HCAP  Now much better    6)Electrolytes  Hypokalemic   being repleted NOrmonatremic   7)Acid base  Co2 at goal    8) GI Nausea Primary team and GI following  [name]/p EGD   Plan:  Will continue current care  Will follow bmet     DianaMANPREET [name] 08/17/2014, 8:52 AM

## 2014-08-17 NOTE — Progress Notes (Signed)
[facility] PROGRESS NOTE  Diana [name] [id number] DOB: 04-04-1986 DOA: 08/08/2014 PCP: Pcp Not In System  Assessment/Plan:  Nausea/vomiting. Persistent. Unable to tolerate clear liquids last evening. EGD unremarkable. Dr Gala Romney opines nausea and vomiting may be related to non-GI systemic illness. Recommends continuing reglan and changing PPP to IV. She is on scheduled anti-emetics.  Ultrasound of RUQ is unremarkable. Lipase and LFTs were normal. Will obtain CT abdomen today.   Healthcare associated multilobar pneumonia. Resolved.  Normal respiratory effort. She remains afebrile with no leukocytosis. She has completed a course of antibiotics. Strep pneumo antigen and Legionella antigen both negative.   Bilateral pleural effusions. These could be parapneumonic or transudative.   Small pericardial effusion per CT and confirmed with echo. ECG with sinus tach at rate of 101. Evaluated by Dr Harl Bowie with cardiology who recommended lasix and coreg. Will need close follow up with cardiology when discharged.   Hypokalemia: mild. Likely related to #1. Will replete and recheck. Check magnesium as well.   Type 2 diabetes mellitus. The patient was diagnosed with diabetes mellitus in 2005 and takes metformin at home. HgA1c 9.4. Continue to hold this. CBG range 137-142. Continues with persistent nausea/vomiting. Continue Levemir and continue SSI. Will monitor and determine discharge plan for DM when close to discharge, oral intake more reliable. Will clearly need close OP follow up for improved control.   Normocytic anemia. Stable. Iron 35, TIBC 207 RBC 3.06 otherwise anemia panel within the limits of normal. Consider iron supplement.   Acute systolic HF: elevated BNP echo with EF 20-25%. May be related to #1 and #2. Remains euvolemic. Weight 89.4kg and stable.  Evaluated by cardiology and started on coreg. Cardiology opines low likelihood of ischemic cardiomyopathy and therefore no plans for  ischemic testing at this time.   Sinus tachycardia: likely related to #1 and #6. Stable range of 95-111. No events on tele. Monitor   Acute kidney injury: creatinine trending downward. Evaluated by nephrology who opine etiology multifactorial i.e. Vancomycin induced, contrast dye, diuresis. Continue IV fluids per nephrology.  Trichomonas: in urine. Treated with Flagyl. Counseled to notify partners    Code Status: full Family Communication: mother at bedside Disposition Plan: home when ready   Consultants:  GI  Nephrology  cardiology  Procedures:   EGD 08/16/14.  Normal esophagus. Stomach empty. Normal-appearing gastric mucosa.  Patent pylorus. Normal first and second portion of the duodenum.  I continue to suspect her nausea and vomiting is secondary to  non--GI systemic illness. She is tolerating Reglan well and seems  to have improved some in the past 24 hours.   Echo 08/08/14. The cavity size was mildly dilated. Wall thickness was normal. Systolic function was severely reduced. The estimated ejection fraction was in the range of 20% to 25%. - Mitral valve: There was mild regurgitation. - Pericardium, extracardiac: A small pericardial effusion was identified.  Antibiotics: Flagyl 08/12/14 x1 dose Vancomycin 08/08/14>> 08/10/14  Cefepime 08/08/14>> 08/11/14  Zithromax 08/08/14-08/09/14  levaquin 08/11/14>>08/13/14  HPI/Subjective: Lying in bed eyes closed but awake. Reports continued nausea/abdominal pain with vomiting.   Objective: Filed Vitals:   08/17/14 0659  BP: 116/82  Pulse: 98  Temp: 97.9 F (36.6 C)  Resp: 20    Intake/Output Summary (Last 24 hours) at 08/17/14 0902 Last data filed at 08/16/14 1738  Gross per 24 hour  Intake    120 ml  Output      0 ml  Net    120 ml   [employer]  08/14/14 0558 08/15/14 0529 08/16/14 0601  Weight: 92.171 kg (203 lb 3.2 oz) 90.357 kg (199 lb 3.2 oz) 89.495 kg (197 lb 4.8 oz)    Exam:   General:  Obese  appears in no acute distress  Cardiovascular: tachycardic but regular, no m/g/r no pitting edema  Respiratory: normal effort BS somewhat distant and coarse. No wheeze  Abdomen: obese soft +BS mild diffuse tenderness +BS  Musculoskeletal: no clubbing or cyanosis   Data Reviewed: Basic Metabolic Panel:  Recent Labs Lab 08/13/14 0607 08/14/14 0834 08/15/14 0654 08/16/14 0626 08/17/14 0653  NA 141 136* 137 140 138  K 3.8 3.7 3.3* 3.7 3.1*  CL 104 100 99 99 99  CO2 24 21 28 30 29   GLUCOSE 108* 178* 140* 146* 150*  BUN 14 14 13 14 14   CREATININE 2.14* 2.08* 2.28* 2.26* 2.07*  CALCIUM 8.7 8.4 8.5 8.5 8.0*   Liver Function Tests:  Recent Labs Lab 08/13/14 0607  AST 26  ALT 15  ALKPHOS 66  BILITOT 0.5  PROT 6.9  ALBUMIN 2.8*    Recent Labs Lab 08/13/14 0607  LIPASE 45   No results found for this basename: AMMONIA,  in the last 168 hours CBC:  Recent Labs Lab 08/11/14 0630 08/13/14 0607  WBC 8.2 9.7  HGB 8.6* 10.3*  HCT 25.4* 30.0*  MCV 84.9 84.5  PLT 319 374   Cardiac Enzymes: No results found for this basename: CKTOTAL, CKMB, CKMBINDEX, TROPONINI,  in the last 168 hours BNP (last 3 results)  Recent Labs  08/08/14 0456  PROBNP 2629.0*   CBG:  Recent Labs Lab 08/16/14 1124 08/16/14 1551 08/16/14 1709 08/16/14 2108 08/17/14 0731  GLUCAP 118* 113* 126* 142* 137*    Recent Results (from the past 240 hour(s))  URINE CULTURE     Status: None   Collection Time    08/12/14 10:00 PM      Result Value Ref Range Status   Specimen Description URINE, RANDOM   Final   Special Requests NONE   Final   Culture  Setup Time     Final   Value: 08/13/2014 13:24     Performed at [place]     Final   Value: 10,000 COLONIES/ML     Performed at [facility]   Culture     Final   Value: YEAST     Performed at [facility]   Report Status 08/15/2014 FINAL   Final     Studies: No results found.  Scheduled  Meds: . benzonatate  100 mg Oral TID  . carvedilol  6.25 mg Oral BID WC  . insulin aspart  0-15 Units Subcutaneous TID WC  . insulin aspart  0-5 Units Subcutaneous QHS  . insulin detemir  14 Units Subcutaneous QHS  . metoCLOPramide (REGLAN) injection  10 mg Intravenous 4 times per day  . ondansetron (ZOFRAN) IV  4 mg Intravenous TID WC & HS  . pantoprazole (PROTONIX) IV  40 mg Intravenous Q12H  . potassium chloride  40 mEq Oral Once   Continuous Infusions:   Principal Problem:   HCAP (healthcare-associated pneumonia) Active Problems:   Pleural effusion, bilateral   Pericardial effusion   Type II or unspecified type diabetes mellitus without mention of complication, not stated as uncontrolled   Anemia, unspecified   Nausea with vomiting   Acute systolic heart failure   Acute renal failure   Sinus tachycardia   Obesity, unspecified  Trichomonas infection    Time spent: 35 minutes    [place] [facility] Pager [phone]. If 7PM-7AM, please contact night-coverage at [email or web address], password [facility] 08/17/2014, 9:02 AM  LOS: 9 days

## 2014-08-17 NOTE — Progress Notes (Signed)
Patient refused her  Potassium by mouth this [place] NP notified.

## 2014-08-17 NOTE — Progress Notes (Signed)
Patient still refusing Potassium by mouth,Karen Black NP,aware. Will continue to monitor patient,no /c/o pain or discomfort noted,no n/v at this time .

## 2014-08-17 NOTE — Progress Notes (Signed)
    Subjective: Drinks cranberry juice but then has to throw it up. Abdominal pain when vomiting a lot. Nausea intermittent. Worse with oral intake.   Objective: Vital signs in last 24 hours: Temp:  [97.9 F (36.6 C)-98.4 F (36.9 C)] 97.9 F (36.6 C) (09/29 0659) Pulse Rate:  [97-111] 98 (09/29 0659) Resp:  [15-27] 20 (09/29 0659) BP: (114-157)/(78-117) 116/82 mmHg (09/29 0659) SpO2:  [94 %-100 %] 98 % (09/29 0659) Last BM Date: 08/14/14 General:   Alert and oriented, pleasant Abdomen:  Bowel sounds present, soft, non-tender, non-distended. No HSM or hernias noted. No rebound or guarding. No masses appreciated  Msk:  Symmetrical without gross deformities. Normal posture. Extremities:  Without edema. Neurologic:  Alert and  oriented x4;  grossly normal neurologically. Skin:  Warm and dry, intact without significant lesions.  Psych:  Alert and cooperative. Normal mood and affect.  Intake/Output from previous day: 09/28 0701 - 09/29 0700 In: 120 [P.O.:120] Out: -  Intake/Output this shift:   BMET  Recent Labs  08/15/14 0654 08/16/14 0626 08/17/14 0653  NA 137 140 138  K 3.3* 3.7 3.1*  CL 99 99 99  CO2 28 30 29   GLUCOSE 140* 146* 150*  BUN 13 14 14   CREATININE 2.28* 2.26* 2.07*  CALCIUM 8.5 8.5 8.0*    Assessment: 28 year old female admitted with pneumonia, acute heart failure with EF 20-25%, acute kidney injury, normocytic anemia, and persistent N/V in the setting of poorly controlled diabetes.  EGD with normal esophagus, normal gastric mucosa, patent pylorus, normal first and second portion of duodenum. Likely component of delayed gastric emptying playing a role; despite Reglan IV scheduled and Zofran scheduled, continues to report nausea and vomiting but does not appear acutely ill. Proceed with CT abd/pelvis due to persistent symptoms, as no CT imaging performed this admission. Parents desire transfer to [facility] if no significant findings on CT; they state they are  pleased with their care but would like further work-up elsewhere if patient does not improve.   Plan: Reglan IV scheduled PPI IV BID Changed Zofran po to IV with meals and bedtime Follow-up on pending CT abd/pelvis  Orvil Feil, ANP-BC [facility]     LOS: 9 days    08/17/2014, 8:03 AM

## 2014-08-17 NOTE — Progress Notes (Signed)
    Primary cardiologist: Dr. Carlyle Dolly  Please see rounding note from yesterday. No overall change in cardiac status, blood pressure well controlled. Maintaining sinus rhythm. Continues on Coreg at this time, no standing diuretic, no ACE inhibitor or ARB with renal insufficiency. Would not add hydralazine/nitrate at this point, would not add Aldactone. She has had some trouble with nausea and emesis (LFTs are normal) being worked up by the primary team. Doubt necessarily related to low output. Does have uncontrolled diabetes and could have gastroparesis. She will need a 1 week followup visit with Dr. Harl Bowie or Ms. Lawrence NP once discharged.  Diana [name], M.D., F.A.C.C.

## 2014-08-17 NOTE — Progress Notes (Signed)
Patient seen and examined. Note reviewed.  This 28 year old female who presents to the emergency room with shortness of breath and was found to have healthcare associated pneumonia and bilateral pleural effusions/pericardial effusion related to acute systolic congestive heart failure. Her ejection fraction was found to be 20-25%. 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 She was treated with antibiotics, and her respiratory status has improved. She was also diuresed with IV Lasix. She's been followed by cardiology. She now appears to be euvolemic and Lasix has been discontinued. Unfortunately, she did develop renal failure which is felt to be multifactorial, related to ATN due to cardiorenal syndrome, hypotension, contrast dye, ACE inhibitor, vancomycin. She'll be followed by nephrology. Her creatinine appears to be stable/trending down. The main issue at this point as her nausea and vomiting. She's been seen by GI and has had an extensive workup. This was relatively unrevealing. She's currently on antiemetics. Await any further input from GI. Plan on discharge home when she is able to tolerate by mouth  Diana [name]

## 2014-08-17 NOTE — Progress Notes (Signed)
Patient encouraged to ambulate in hall. She refused. Patient educated on importance of getting up and moving to prevent further problems with lungs. Educated on hazards of immobility. Will continue to monitor and encourage activity.

## 2014-08-17 NOTE — Progress Notes (Signed)
REVIEWED. Change PPI, ZOFRAN, AND REGLAN TO QAC/HS.

## 2014-08-17 NOTE — Progress Notes (Signed)
Patient ambulated in hallway this morning times one assist,without difficulty. Will continue to monitor patient.

## 2014-08-18 DIAGNOSIS — K5900 Constipation, unspecified: Secondary | ICD-10-CM

## 2014-08-18 LAB — COMPREHENSIVE METABOLIC PANEL
ALT: 17 U/L (ref 0–35)
AST: 17 U/L (ref 0–37)
Albumin: 3 g/dL — ABNORMAL LOW (ref 3.5–5.2)
Alkaline Phosphatase: 61 U/L (ref 39–117)
Anion gap: 12 (ref 5–15)
BUN: 13 mg/dL (ref 6–23)
CO2: 27 mEq/L (ref 19–32)
Calcium: 8.6 mg/dL (ref 8.4–10.5)
Chloride: 97 mEq/L (ref 96–112)
Creatinine, Ser: 1.88 mg/dL — ABNORMAL HIGH (ref 0.50–1.10)
GFR calc Af Amer: 42 mL/min — ABNORMAL LOW (ref >90)
GFR calc non Af Amer: 36 mL/min — ABNORMAL LOW (ref >90)
Glucose, Bld: 118 mg/dL — ABNORMAL HIGH (ref 70–99)
Potassium: 3.5 mEq/L — ABNORMAL LOW (ref 3.7–5.3)
Sodium: 136 mEq/L — ABNORMAL LOW (ref 137–147)
Total Bilirubin: 0.6 mg/dL (ref 0.3–1.2)
Total Protein: 6.8 g/dL (ref 6.0–8.3)

## 2014-08-18 LAB — CBC
HCT: 24.8 % — ABNORMAL LOW (ref 36.0–46.0)
Hemoglobin: 8.6 g/dL — ABNORMAL LOW (ref 12.0–15.0)
MCH: 29.2 pg (ref 26.0–34.0)
MCHC: 34.7 g/dL (ref 30.0–36.0)
MCV: 84.1 fL (ref 78.0–100.0)
Platelets: 263 10*3/uL (ref 150–400)
RBC: 2.95 MIL/uL — ABNORMAL LOW (ref 3.87–5.11)
RDW: 11.9 % (ref 11.5–15.5)
WBC: 11.3 10*3/uL — ABNORMAL HIGH (ref 4.0–10.5)

## 2014-08-18 LAB — BASIC METABOLIC PANEL
Anion gap: 10 (ref 5–15)
BUN: 12 mg/dL (ref 6–23)
CO2: 30 mEq/L (ref 19–32)
Calcium: 8.3 mg/dL — ABNORMAL LOW (ref 8.4–10.5)
Chloride: 98 mEq/L (ref 96–112)
Creatinine, Ser: 2.03 mg/dL — ABNORMAL HIGH (ref 0.50–1.10)
GFR calc Af Amer: 38 mL/min — ABNORMAL LOW (ref >90)
GFR calc non Af Amer: 33 mL/min — ABNORMAL LOW (ref >90)
Glucose, Bld: 108 mg/dL — ABNORMAL HIGH (ref 70–99)
Potassium: 3.1 mEq/L — ABNORMAL LOW (ref 3.7–5.3)
Sodium: 138 mEq/L (ref 137–147)

## 2014-08-18 LAB — GLUCOSE, CAPILLARY
Glucose-Capillary: 104 mg/dL — ABNORMAL HIGH (ref 70–99)
Glucose-Capillary: 112 mg/dL — ABNORMAL HIGH (ref 70–99)
Glucose-Capillary: 120 mg/dL — ABNORMAL HIGH (ref 70–99)

## 2014-08-18 LAB — MAGNESIUM: Magnesium: 1.6 mg/dL (ref 1.5–2.5)

## 2014-08-18 MED ORDER — BISACODYL 10 MG RE SUPP
10.0000 mg | Freq: Once | RECTAL | Status: AC
  Administered 2014-08-18: 10 mg via RECTAL
  Filled 2014-08-18: qty 1

## 2014-08-18 MED ORDER — POLYETHYLENE GLYCOL 3350 17 G PO PACK
17.0000 g | PACK | Freq: Every day | ORAL | Status: DC | PRN
  Administered 2014-08-18: 17 g via ORAL
  Filled 2014-08-18: qty 1

## 2014-08-18 MED ORDER — POTASSIUM CHLORIDE CRYS ER 20 MEQ PO TBCR
40.0000 meq | EXTENDED_RELEASE_TABLET | ORAL | Status: AC
  Administered 2014-08-18 (×2): 40 meq via ORAL
  Filled 2014-08-18 (×2): qty 2

## 2014-08-18 NOTE — Progress Notes (Signed)
Diana [name]  MRN: [id number]  DOB/AGE: October 28, 1986 28 y.o.  Primary Care Physician:Pcp Not In System  Admit date: 08/08/2014  Chief Complaint:  Chief Complaint  Patient presents with  . Cough    S-Pt presented on  08/08/2014 with  Chief Complaint  Patient presents with  . Cough  .    Pt  says she still has nausea.  Meds . benzonatate  100 mg Oral TID  . carvedilol  6.25 mg Oral BID WC  . insulin aspart  0-15 Units Subcutaneous TID WC  . insulin aspart  0-5 Units Subcutaneous QHS  . insulin detemir  14 Units Subcutaneous QHS  . metoCLOPramide (REGLAN) injection  10 mg Intravenous TID AC & HS  . ondansetron (ZOFRAN) IV  4 mg Intravenous TID AC & HS  . pantoprazole (PROTONIX) IV  40 mg Intravenous BID AC     Physical Exam: Vital signs in last 24 hours: Temp:  [98.2 F (36.8 C)-98.9 F (37.2 C)] 98.9 F (37.2 C) (09/30 0500) Pulse Rate:  [74-97] 97 (09/30 0500) Resp:  [20] 20 (09/30 0500) BP: (114-135)/(75-95) 135/95 mmHg (09/30 0500) SpO2:  [99 %-100 %] 100 % (09/30 0500) Weight:  [194 lb 14.4 oz (88.406 kg)] 194 lb 14.4 oz (88.406 kg) (09/29 1056) Weight change:  Last BM Date: 08/14/14  Intake/Output from previous day: 09/29 0701 - 09/30 0700 In: 240 [P.O.:240] Out: -      Physical Exam: General- pt is awake,alert, oriented to time place and person Resp- No acute REsp distress, CTA B/L NO Rhonchi CVS- S1S2 regular in rate and rhythm GIT- BS+, soft, NT, ND EXT- NO LE Edema, Cyanosis   Lab Results:  Results for Diana [name] (MRN [id number]) as of 08/17/2014 08:52  Ref. Range 08/13/2014 06:07  Hemoglobin Latest Range: 12.0-15.0 g/dL 10.3 (L)     BMET  Recent Labs  08/17/14 0653 08/18/14 0551  NA 138 138  K 3.1* 3.1*  CL 99 98  CO2 29 30  GLUCOSE 150* 108*  BUN 14 12  CREATININE 2.07* 2.03*  CALCIUM 8.0* 8.3*   Trend Creat  2015 0.79=>1.03=>1.7=>2.36=>2.0    MICRO Recent Results (from the past 240 hour(s))  URINE CULTURE      Status: None   Collection Time    08/12/14 10:00 PM      Result Value Ref Range Status   Specimen Description URINE, RANDOM   Final   Special Requests NONE   Final   Culture  Setup Time     Final   Value: 08/13/2014 13:24     Performed at [place]     Final   Value: 10,000 COLONIES/ML     Performed at [facility]   Culture     Final   Value: YEAST     Performed at [facility]   Report Status 08/15/2014 FINAL   Final      Lab Results  Component Value Date   CALCIUM 8.3* 08/18/2014           Impression: 1)Renal AKI secondary to Multiple factors  Sepsis- admitted with Pneumonia  NSAIDS- on Naproxen as oputpt  Hypotension- Bp on lower side  ACE- now dc.ed  Cardiorenal Ef on lower side  Vanco - trough was high  IV dye   Auto immune workup negative  Most Likley ATN AKi improving slowly, now at plateau. 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

## 2014-08-18 NOTE — Progress Notes (Signed)
[facility] PROGRESS NOTE  Diana [name] [id number] DOB: Apr 18, 1986 DOA: 08/08/2014 PCP: Pcp Not In System  Assessment/Plan: Nausea/vomiting. Slight improvement. Ate a taco yesterday. Abdominal US unremarkable.  EGD unremarkable. CT abdomen pelvis reveals heavy stool burden. Patient states "i feel like i need to go to bathroom". Will provide suppository now and prn as well as Mirilax if she can tolerate. Evaluated by GI who recommends continuing reglan and scheduled zofran. Continue PPI.  Advance diet.   Healthcare associated multilobar pneumonia. Resolved. Normal respiratory effort. She remains afebrile with no leukocytosis. She has completed a course of antibiotics. Strep pneumo antigen and Legionella antigen both negative.   Bilateral pleural effusions. These could be parapneumonic or transudative.   Small pericardial effusion per CT and confirmed with echo. ECG with sinus tach at rate of 101. Evaluated by Dr Harl Bowie with cardiology who recommended lasix and coreg. Will need close follow up with cardiology when discharged.   Hypokalemia: mild. Likely related to #1. Patient refused supplement yesterday. Will try today. Has midline IV so no IV potassium. Magnesium level within the limits of normal but on low end.    Type 2 diabetes mellitus. The patient was diagnosed with diabetes mellitus in 2005 and takes metformin at home. HgA1c 9.4. Continue to hold this. CBG range 104.  Continue Levemir and continue SSI. Will monitor and determine discharge plan for DM when close to discharge, oral intake more reliable. Will clearly need close OP follow up for improved control.   Normocytic anemia. Stable. Iron 35, TIBC 207 RBC 3.06 otherwise anemia panel within the limits of normal. Consider iron supplement.   Acute systolic HF: elevated BNP echo with EF 20-25%. May be related to #1 and #2. Remains euvolemic. Weight 88.4kg and stable. Evaluated by cardiology and started on coreg. Cardiology  opines low likelihood of ischemic cardiomyopathy and therefore no plans for ischemic testing at this time. Evaluated yesterday and recommend no HCTZ/nitrate or aldactone at this point. Will need follow up with Dr Harl Bowie or K. Lawrence NP 1 week after discharge.    Sinus tachycardia: likely related to #1 and #6. Stable range of 95-111. No events on tele. Monitor   Acute kidney injury: creatinine stable. Evaluated by nephrology who opine etiology multifactorial i.e. Vancomycin induced, contrast dye, diuresis.    Trichomonas: in urine. Treated with Flagyl. Counseled to notify partners     Code Status: full Family Communication: none present Disposition Plan: home hopefully 24-48 hours   Consultants: GI  Nephrology  cardiology  Procedures: EGD 08/16/14.  Normal esophagus. Stomach empty. Normal-appearing gastric mucosa.  Patent pylorus. Normal first and second portion of the duodenum.  I continue to suspect her nausea and vomiting is secondary to  non--GI systemic illness. She is tolerating Reglan well and seems  to have improved some in the past 24 hours.   Echo 08/08/14.  The cavity size was mildly dilated. Wall thickness was normal. Systolic function was severely reduced. The estimated ejection fraction was in the range of 20% to 25%. - Mitral valve: There was mild regurgitation. - Pericardium, extracardiac: A small pericardial effusion was identified.   Antibiotics: Flagyl 08/12/14 x1 dose Vancomycin 08/08/14>> 08/10/14  Cefepime 08/08/14>> 08/11/14  Zithromax 08/08/14-08/09/14  levaquin 08/11/14>>08/13/14   HPI/Subjective: Lying in bed eyes closed. Easily aroused. Needs encouragement to engage. Reports continued nausea but no abdominal pain. Reports feeling constipated.   Objective: Filed Vitals:   08/18/14 0500  BP: 135/95  Pulse: 97  Temp: 98.9 F (37.2 C)  Resp: 20    Intake/Output Summary (Last 24 hours) at 08/18/14 1149 Last data filed at 08/17/14 2047  Gross per  24 hour  Intake    240 ml  Output      0 ml  Net    240 ml   Filed Weights   08/15/14 0529 08/16/14 0601 08/17/14 1056  Weight: 90.357 kg (199 lb 3.2 oz) 89.495 kg (197 lb 4.8 oz) 88.406 kg (194 lb 14.4 oz)    Exam:   General:  Obese no acute distress  Cardiovascular: RRR i hear no MGR No LE edema  Respiratory: normal effort bs clear bilaterally no wheeze  Abdomen: obese soft +BS non-distended no guarding or reboundin  Musculoskeletal: good muscle tone. No clubbing or cyanosis   Data Reviewed: Basic Metabolic Panel:  Recent Labs Lab 08/14/14 0834 08/15/14 0654 08/16/14 0626 08/17/14 0648 08/17/14 0653 08/18/14 0551  NA 136* 137 140  --  138 138  K 3.7 3.3* 3.7  --  3.1* 3.1*  CL 100 99 99  --  99 98  CO2 21 28 30   --  29 30  GLUCOSE 178* 140* 146*  --  150* 108*  BUN 14 13 14   --  14 12  CREATININE 2.08* 2.28* 2.26*  --  2.07* 2.03*  CALCIUM 8.4 8.5 8.5  --  8.0* 8.3*  MG  --   --   --  1.5  --   --    Liver Function Tests:  Recent Labs Lab 08/13/14 0607  AST 26  ALT 15  ALKPHOS 66  BILITOT 0.5  PROT 6.9  ALBUMIN 2.8*    Recent Labs Lab 08/13/14 0607  LIPASE 45   No results found for this basename: AMMONIA,  in the last 168 hours CBC:  Recent Labs Lab 08/13/14 0607 08/18/14 0551  WBC 9.7 11.3*  HGB 10.3* 8.6*  HCT 30.0* 24.8*  MCV 84.5 84.1  PLT 374 263   Cardiac Enzymes: No results found for this basename: CKTOTAL, CKMB, CKMBINDEX, TROPONINI,  in the last 168 hours BNP (last 3 results)  Recent Labs  08/08/14 0456  PROBNP 2629.0*   CBG:  Recent Labs Lab 08/17/14 0731 08/17/14 1137 08/17/14 1635 08/17/14 2000 08/18/14 0759  GLUCAP 137* 116* 126* 147* 104*    Recent Results (from the past 240 hour(s))  URINE CULTURE     Status: None   Collection Time    08/12/14 10:00 PM      Result Value Ref Range Status   Specimen Description URINE, RANDOM   Final   Special Requests NONE   Final   Culture  Setup Time     Final    Value: 08/13/2014 13:24     Performed at [place]     Final   Value: 10,000 COLONIES/ML     Performed at [facility]   Culture     Final   Value: YEAST     Performed at [facility]   Report Status 08/15/2014 FINAL   Final     Studies: Ct Abdomen Pelvis Wo Contrast  08/17/2014   CLINICAL DATA:  Upper abdominal pain. Nausea, vomiting. Go to contrast Plain films 08/12/2014  EXAM: CT ABDOMEN AND PELVIS WITHOUT CONTRAST  TECHNIQUE: Multidetector CT imaging of the abdomen and pelvis was performed following the standard protocol without IV contrast.  COMPARISON:  Plain films 08/12/2014  FINDINGS: There are small bilateral pleural effusions and pericardial effusion. Heart  is mildly enlarged. Linear atelectasis in the left lung base.  Liver, gallbladder, spleen, pancreas, adrenals and kidneys have an unremarkable unenhanced appearance. Stomach, large and small bowel are unremarkable. Aorta is normal caliber. No free fluid, free air or adenopathy.  Uterus, right ovary and urinary bladder have a grossly unremarkable unenhanced appearance. Low-density area within the rib left ovary/adnexa measuring approximately 4 cm, likely cyst. This cannot be characterized on this noncontrast CT.  No acute bony abnormality.  IMPRESSION: No acute findings in the abdomen or pelvis on this unenhanced CT.  Small bilateral pleural effusions and pericardial effusion. Heart borderline enlarged.  4 cm low-density area within the left ovary/adnexae, likely cyst although this cannot be fully characterized on this noncontrast CT.   Electronically Signed   By: Rolm Baptise M.D.   On: 08/17/2014 11:47    Scheduled Meds: . benzonatate  100 mg Oral TID  . carvedilol  6.25 mg Oral BID WC  . insulin aspart  0-15 Units Subcutaneous TID WC  . insulin aspart  0-5 Units Subcutaneous QHS  . insulin detemir  14 Units Subcutaneous QHS  . metoCLOPramide (REGLAN) injection  10 mg Intravenous TID AC &  HS  . ondansetron (ZOFRAN) IV  4 mg Intravenous TID AC & HS  . pantoprazole (PROTONIX) IV  40 mg Intravenous BID AC   Continuous Infusions:   Principal Problem:   HCAP (healthcare-associated pneumonia) Active Problems:   Pleural effusion, bilateral   Pericardial effusion   Type II or unspecified type diabetes mellitus without mention of complication, not stated as uncontrolled   Anemia, unspecified   Nausea with vomiting   Acute systolic heart failure   Acute renal failure   Sinus tachycardia   Obesity, unspecified   Trichomonas infection    Time spent: 35 minutes    [place] [facility] Pager [phone]. If 7PM-7AM, please contact night-coverage at [email or web address], password [facility] 08/18/2014, 11:49 AM  LOS: 10 days

## 2014-08-18 NOTE — Progress Notes (Signed)
Patient ambulated in hallway today without difficulty.Patient also received a Dulcolax suppository this am,per MD order,patient had a small bowel movement,loose brown stool. Will continue to monitor patient.

## 2014-08-18 NOTE — Progress Notes (Signed)
Pt seen and examined. Agree with assessment and plan as outlined above. Briefly, pt presents with HCAP that is resolved and n/v. CT abd with stool throughout the colon, most notably through the transverse colon. Agree with a trial of bowel regimen. 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 Cardiology following with recs for 1 week f/u with Dr. Harl Bowie or Ms. Lawrence NP when discharged.

## 2014-08-18 NOTE — Progress Notes (Signed)
Subjective: Continues to report nausea and vomiting. Laying in bed with eyes closed, not opening to speak. States she ate a taco yesterday. Unable to quantify how much.   Objective: Vital signs in last 24 hours: Temp:  [98.2 F (36.8 C)-98.9 F (37.2 C)] 98.9 F (37.2 C) (09/30 0500) Pulse Rate:  [74-97] 97 (09/30 0500) Resp:  [20] 20 (09/30 0500) BP: (114-135)/(75-95) 135/95 mmHg (09/30 0500) SpO2:  [99 %-100 %] 100 % (09/30 0500) Weight:  [194 lb 14.4 oz (88.406 kg)] 194 lb 14.4 oz (88.406 kg) (09/29 1056) Last BM Date: 08/14/14 General:   Alert and oriented, laying with eyes closed. Flat affect.  Head:  Normocephalic and atraumatic. Eyes:  No icterus, sclera clear. Conjuctiva pink.  Abdomen:  Bowel sounds present, soft, non-tender, non-distended. No HSM or hernias noted. No rebound or guarding. No masses appreciated  Extremities:  Without edema. Neurologic:  Alert and  oriented x4 Psych:  Limited answers. Flat affect.   Intake/Output from previous day: 09/29 0701 - 09/30 0700 In: 240 [P.O.:240] Out: -  Intake/Output this shift:    Lab Results:  Recent Labs  08/18/14 0551  WBC 11.3*  HGB 8.6*  HCT 24.8*  PLT 263   BMET  Recent Labs  08/16/14 0626 08/17/14 0653 08/18/14 0551  NA 140 138 138  K 3.7 3.1* 3.1*  CL 99 99 98  CO2 30 29 30   GLUCOSE 146* 150* 108*  BUN 14 14 12   CREATININE 2.26* 2.07* 2.03*  CALCIUM 8.5 8.0* 8.3*     Studies/Results: Ct Abdomen Pelvis Wo Contrast  08/17/2014   CLINICAL DATA:  Upper abdominal pain. Nausea, vomiting. Go to contrast Plain films 08/12/2014  EXAM: CT ABDOMEN AND PELVIS WITHOUT CONTRAST  TECHNIQUE: Multidetector CT imaging of the abdomen and pelvis was performed following the standard protocol without IV contrast.  COMPARISON:  Plain films 08/12/2014  FINDINGS: There are small bilateral pleural effusions and pericardial effusion. Heart is mildly enlarged. Linear atelectasis in the left lung base.  Liver,  gallbladder, spleen, pancreas, adrenals and kidneys have an unremarkable unenhanced appearance. Stomach, large and small bowel are unremarkable. Aorta is normal caliber. No free fluid, free air or adenopathy.  Uterus, right ovary and urinary bladder have a grossly unremarkable unenhanced appearance. Low-density area within the rib left ovary/adnexa measuring approximately 4 cm, likely cyst. This cannot be characterized on this noncontrast CT.  No acute bony abnormality.  IMPRESSION: No acute findings in the abdomen or pelvis on this unenhanced CT.  Small bilateral pleural effusions and pericardial effusion. Heart borderline enlarged.  4 cm low-density area within the left ovary/adnexae, likely cyst although this cannot be fully characterized on this noncontrast CT.   Electronically Signed   By: Rolm Baptise M.D.   On: 08/17/2014 11:47    Assessment: 28 year old female admitted with pneumonia, acute heart failure with EF 20-25%, acute kidney injury, normocytic anemia, and persistent N/V in the setting of poorly controlled diabetes. EGD with normal esophagus, normal gastric mucosa, patent pylorus, normal first and second portion of duodenum. Likely delayed gastric emptying in the setting of poorly controlled diabetes playing a role. Interestingly, despite continued reports of N/V, she was able to tolerate a taco yesterday. Will attempt to advance diet for lunch.   CT abd/pelvis: without acute findings. Discussed with hospitalist. Stool burden on CT. Underlying constipation could also be contributing to persistent nausea.   GES could be considered, but this will not be entirely accurate due to current scheduled  medications; discussed with radiology, who states GES could be performed with holding Zofran and Reglan for 6 hours instead of 24, with the caveat that findings would need to be interpreted appropriately. GES likely unnecessary as there has been improvement with scheduled Zofran and Reglan. Patient  willing to try advancement of diet today.   Plan: Continue PPI BID Reglan and Zofran scheduled with meals Supportive measures for constipation to include suppository now and Miralax daily prn Tight glycemic control   Orvil Feil, ANP-BC [facility]    LOS: 10 days    08/18/2014, 9:22 AM

## 2014-08-19 DIAGNOSIS — R112 Nausea with vomiting, unspecified: Secondary | ICD-10-CM

## 2014-08-19 DIAGNOSIS — J810 Acute pulmonary edema: Secondary | ICD-10-CM

## 2014-08-19 DIAGNOSIS — J189 Pneumonia, unspecified organism: Secondary | ICD-10-CM

## 2014-08-19 DIAGNOSIS — K5901 Slow transit constipation: Secondary | ICD-10-CM

## 2014-08-19 DIAGNOSIS — G43A1 Cyclical vomiting, intractable: Secondary | ICD-10-CM

## 2014-08-19 DIAGNOSIS — N179 Acute kidney failure, unspecified: Secondary | ICD-10-CM

## 2014-08-19 LAB — GLUCOSE, CAPILLARY
Glucose-Capillary: 140 mg/dL — ABNORMAL HIGH (ref 70–99)
Glucose-Capillary: 111 mg/dL — ABNORMAL HIGH (ref 70–99)

## 2014-08-19 LAB — CBC
HCT: 24.3 % — ABNORMAL LOW (ref 36.0–46.0)
Hemoglobin: 8.3 g/dL — ABNORMAL LOW (ref 12.0–15.0)
MCH: 28.8 pg (ref 26.0–34.0)
MCHC: 34.2 g/dL (ref 30.0–36.0)
MCV: 84.4 fL (ref 78.0–100.0)
Platelets: 218 10*3/uL (ref 150–400)
RBC: 2.88 MIL/uL — ABNORMAL LOW (ref 3.87–5.11)
RDW: 12.1 % (ref 11.5–15.5)
WBC: 10.8 10*3/uL — ABNORMAL HIGH (ref 4.0–10.5)

## 2014-08-19 LAB — BASIC METABOLIC PANEL
Anion gap: 10 (ref 5–15)
BUN: 12 mg/dL (ref 6–23)
CO2: 29 mEq/L (ref 19–32)
Calcium: 8.3 mg/dL — ABNORMAL LOW (ref 8.4–10.5)
Chloride: 102 mEq/L (ref 96–112)
Creatinine, Ser: 1.91 mg/dL — ABNORMAL HIGH (ref 0.50–1.10)
GFR calc Af Amer: 41 mL/min — ABNORMAL LOW (ref >90)
GFR calc non Af Amer: 35 mL/min — ABNORMAL LOW (ref >90)
Glucose, Bld: 130 mg/dL — ABNORMAL HIGH (ref 70–99)
Potassium: 3.2 mEq/L — ABNORMAL LOW (ref 3.7–5.3)
Sodium: 141 mEq/L (ref 137–147)

## 2014-08-19 MED ORDER — LUBIPROSTONE 24 MCG PO CAPS
24.0000 ug | ORAL_CAPSULE | Freq: Two times a day (BID) | ORAL | Status: DC
  Filled 2014-08-19 (×3): qty 1

## 2014-08-19 MED ORDER — POTASSIUM CHLORIDE CRYS ER 20 MEQ PO TBCR
40.0000 meq | EXTENDED_RELEASE_TABLET | Freq: Once | ORAL | Status: AC
  Administered 2014-08-19: 40 meq via ORAL
  Filled 2014-08-19: qty 2

## 2014-08-19 MED ORDER — POTASSIUM CHLORIDE CRYS ER 20 MEQ PO TBCR: 40.0000 meq | EXTENDED_RELEASE_TABLET | Freq: Two times a day (BID) | ORAL | Status: DC

## 2014-08-19 MED ORDER — METOCLOPRAMIDE HCL 10 MG PO TABS: 10.0000 mg | ORAL_TABLET | Freq: Three times a day (TID) | ORAL | Status: DC

## 2014-08-19 MED ORDER — DOCUSATE SODIUM 100 MG PO CAPS: 100.0000 mg | ORAL_CAPSULE | Freq: Two times a day (BID) | ORAL | Status: DC

## 2014-08-19 MED ORDER — CARVEDILOL 6.25 MG PO TABS: 6.2500 mg | ORAL_TABLET | Freq: Two times a day (BID) | ORAL | Status: AC

## 2014-08-19 MED ORDER — MORPHINE SULFATE 2 MG/ML IJ SOLN: 2.0000 mg | INTRAMUSCULAR | Status: DC | PRN

## 2014-08-19 MED ORDER — LINACLOTIDE 145 MCG PO CAPS
145.0000 ug | ORAL_CAPSULE | Freq: Every day | ORAL | Status: DC
  Administered 2014-08-19: 145 ug via ORAL
  Filled 2014-08-19: qty 1

## 2014-08-19 MED ORDER — PANTOPRAZOLE SODIUM 20 MG PO TBEC: 20.0000 mg | DELAYED_RELEASE_TABLET | Freq: Every day | ORAL | Status: DC

## 2014-08-19 NOTE — Progress Notes (Signed)
Subjective:  Feels better. Didn't like food yesterday. Recommended patient order preferred food from dietary.   Objective: Vital signs in last 24 hours: Temp:  [98.3 F (36.8 C)-98.6 F (37 C)] 98.3 F (36.8 C) (10/01 0745) Pulse Rate:  [87-100] 100 (10/01 0745) Resp:  [16-20] 16 (10/01 0745) BP: (116-121)/(75-78) 116/78 mmHg (10/01 0745) SpO2:  [98 %-99 %] 98 % (10/01 0745) Last BM Date: 08/14/14 General:   Alert,  Well-developed, well-nourished, pleasant and cooperative in NAD Head:  Normocephalic and atraumatic. Eyes:  Sclera clear, no icterus.  Chest: CTA bilaterally without rales, rhonchi, crackles.    Heart:  Regular rate and rhythm; no murmurs, clicks, rubs,  or gallops. Abdomen:  Soft, nontender and nondistended. No masses, hepatosplenomegaly or hernias noted. Normal bowel sounds, without guarding, and without rebound.   Extremities:  Without clubbing, deformity or edema. Neurologic:  Alert and  oriented x4;  grossly normal neurologically. Skin:  Intact without significant lesions or rashes. Psych:  Alert and cooperative. Normal mood and affect.  Intake/Output from previous day:   Intake/Output this shift:    Lab Results: CBC  Recent Labs  08/18/14 0551 08/19/14 0637  WBC 11.3* 10.8*  HGB 8.6* 8.3*  HCT 24.8* 24.3*  MCV 84.1 84.4  PLT 263 218   BMET  Recent Labs  08/18/14 0551 08/18/14 2205 08/19/14 0637  NA 138 136* 141  K 3.1* 3.5* 3.2*  CL 98 97 102  CO2 30 27 29   GLUCOSE 108* 118* 130*  BUN 12 13 12   CREATININE 2.03* 1.88* 1.91*  CALCIUM 8.3* 8.6 8.3*   LFTs  Recent Labs  08/18/14 2205  BILITOT 0.6  ALKPHOS 61  AST 17  ALT 17  PROT 6.8  ALBUMIN 3.0*   No results found for this basename: LIPASE,  in the last 72 hours PT/INR No results found for this basename: LABPROT, INR,  in the last 72 hours    Imaging Studies: Ct Abdomen Pelvis Wo Contrast  08/17/2014   CLINICAL DATA:  Upper abdominal pain. Nausea, vomiting. Go to  contrast Plain films 08/12/2014  EXAM: CT ABDOMEN AND PELVIS WITHOUT CONTRAST  TECHNIQUE: Multidetector CT imaging of the abdomen and pelvis was performed following the standard protocol without IV contrast.  COMPARISON:  Plain films 08/12/2014  FINDINGS: There are small bilateral pleural effusions and pericardial effusion. Heart is mildly enlarged. Linear atelectasis in the left lung base.  Liver, gallbladder, spleen, pancreas, adrenals and kidneys have an unremarkable unenhanced appearance. Stomach, large and small bowel are unremarkable. Aorta is normal caliber. No free fluid, free air or adenopathy.  Uterus, right ovary and urinary bladder have a grossly unremarkable unenhanced appearance. Low-density area within the rib left ovary/adnexa measuring approximately 4 cm, likely cyst. This cannot be characterized on this noncontrast CT.  No acute bony abnormality.  IMPRESSION: No acute findings in the abdomen or pelvis on this unenhanced CT.  Small bilateral pleural effusions and pericardial effusion. Heart borderline enlarged.  4 cm low-density area within the left ovary/adnexae, likely cyst although this cannot be fully characterized on this noncontrast CT.   Electronically Signed   By: Rolm Baptise M.D.   On: 08/17/2014 11:47        US Abdomen Limited Ruq  08/13/2014   CLINICAL DATA:  Persistent nausea, vomiting  EXAM: US ABDOMEN LIMITED - RIGHT UPPER QUADRANT  COMPARISON:  None.  FINDINGS: Gallbladder:  No gallstones or wall thickening visualized. No sonographic Murphy sign noted.  Common  bile duct:  Diameter: 3.2 mm  Liver:  No focal lesion identified. Within normal limits in parenchymal echogenicity.  IMPRESSION: Unremarkable right upper quadrant ultrasound.   Electronically Signed   By: Lahoma Crocker M.D.   On: 08/13/2014 09:45  [2 weeks]   Assessment: 28 year old female admitted with pneumonia, acute heart failure with EF 20-25%, acute kidney injury, normocytic anemia, and persistent N/V in the  setting of poorly controlled diabetes. EGD with normal esophagus, normal gastric mucosa, patent pylorus, normal first and second portion of duodenum. Likely delayed gastric emptying in the setting of poorly controlled diabetes/systemic illness playing a role. Ate taco couple of nights ago. States she didn't like her food choices yesterday so she just had a popsicle. States her appetite has improved.    CT abd/pelvis: without acute findings. Stool burden on CT. Underlying constipation could also be contributing to persistent nausea. BM yesterday.    Plan: 1. Continue scheduled Reglan/Zofran.  2. Will change from [place] to [place] given potential for significant nausea if Amitiza taken without food.  3. Encouraged po intake.  4. Re-evaluate after lunch.    LOS: 11 days   Neil Crouch  08/19/2014, 9:12 AM

## 2014-08-19 NOTE — Progress Notes (Signed)
Subjective: Interval History: Still she has some nausea this morning but no vomiting. No difficulty in breathing  Objective: Vital signs in last 24 hours: Temp:  [98.3 F (36.8 C)-98.6 F (37 C)] 98.3 F (36.8 C) (10/01 0745) Pulse Rate:  [87-100] 100 (10/01 0745) Resp:  [16-20] 16 (10/01 0745) BP: (116-121)/(75-78) 116/78 mmHg (10/01 0745) SpO2:  [98 %-99 %] 98 % (10/01 0745) Weight change:   Intake/Output from previous day:   Intake/Output this shift: Total I/O In: 360 [P.O.:360] Out: -   She si alert and in no apparent distress Chest decrease breath sound no rale or wheezing Heart : RRR no murmur Abdomen: Soft and none tender No edema  Lab Results:  Recent Labs  08/18/14 0551 08/19/14 0637  WBC 11.3* 10.8*  HGB 8.6* 8.3*  HCT 24.8* 24.3*  PLT 263 218   BMET:   Recent Labs  08/18/14 2205 08/19/14 0637  NA 136* 141  K 3.5* 3.2*  CL 97 102  CO2 27 29  GLUCOSE 118* 130*  BUN 13 12  CREATININE 1.88* 1.91*  CALCIUM 8.6 8.3*   No results found for this basename: PTH,  in the last 72 hours Iron Studies:  No results found for this basename: IRON, TIBC, TRANSFERRIN, FERRITIN,  in the last 72 hours  Studies/Results: No results found.  I have reviewed the patient's current medications.  Assessment/Plan: Problem #1 acute kidney injury: Her renal function seems stble Problem #2 diabetes: Presently on insulin> Denies any polyuria or polydepsia Problem #3 healthcare associated pneumonia: Patient is getting better and she is asymptomatic Problem #4 cardiomyopathy with low EF. She is asymptomaic Problem #5 anemia: her hemoglobin is stable Problem #6 hypertension: Her blood pressure is reasonably controlled.  Problem#7 Hypokalemia: Her potassium has declined further Problem#8 Nausea and vomiting. Still she complains of some nausea this morning Plan: kcl 40 meq po now and another 40 meq after 4 hours We'll check her basic metabolic panel in the morning.   LOS: 11 days   Cami Delawder S 08/19/2014,12:11 PM

## 2014-08-19 NOTE — Progress Notes (Signed)
REVIEWED. AGREE. NO ADDITIONAL RECOMMENDATIONS. 

## 2014-08-19 NOTE — Discharge Summary (Signed)
Physician Discharge Summary  Diana [name] [id number] DOB: [birth year] DOA: 08/08/2014  PCP: Pcp Not In System  Admit date: 08/08/2014 Discharge date: 08/19/2014  Time spent: 40 minutes  Recommendations for Outpatient Follow-up:  Recommendations for Outpatient Follow-up:  1. Follow up with Cardiology 08/27/14 as scheduled for evaluation of acute systolic HF. Started on Coreg. No standing diuretic at discharge, no ACE inhibitor or ARB due to renal insufficiency ( that is improving at discharge).  2. Follow up with PCP1-2 weeks for evaluation of symptoms. Recommend BMET to track kidney function and potassium level. Also recommend close OP follow up for uncontrolled diabetes 3. Dr Hinda Lenis in 4 weeks  Discharge Diagnoses:  Principal Problem:   HCAP (healthcare-associated pneumonia) Active Problems:   Pleural effusion, bilateral   Pericardial effusion   Type II or unspecified type diabetes mellitus without mention of complication, not stated as uncontrolled   Anemia, unspecified   Nausea with vomiting   Acute systolic heart failure   Acute renal failure   Sinus tachycardia   Obesity, unspecified   Trichomonas infection   Unspecified constipation   Discharge Condition: stable  Diet recommendation: carb modified  Filed Weights   08/15/14 0529 08/16/14 0601 08/17/14 1056  Weight: 90.357 kg (199 lb 3.2 oz) 89.495 kg (197 lb 4.8 oz) 88.406 kg (194 lb 14.4 oz)    History of present illness:  Diana [name] is a 28 y.o. female with a history of diabetes mellitus, who presented to the emergency department with a chief complaint of shortness of breath and cough. She had been short of breath for more than one week, mostly with any type of activity. She had associated orthopnea and found it difficult to lay flat when sleeping. She had some pleurisy, mostly on the right. No other chest pain. Her cough had been intermittently productive with yellow sputum. She had chills but no fever.  She had 2 episodes of nausea and vomiting without coffee grounds emesis. She denied diarrhea or abdominal pain. She denied pain with urination. She had intermittent swelling in both legs which she attributed to standing on her feet at work. She had a recent stuffy nose and a frontal headache. She denied tobacco and illicit drug use. She had no prior history of heart disease or congestive heart failure.  In the emergency department, she was afebrile and sinus tachycardia, but her blood pressure was within normal limits. Her white blood cell count was within normal limits at 8.9. Her lab data were significant for a pro BNP of 2629 and a venous glucose of 205. Her urine pregnancy test was negative. Her chest x-ray revealed small bilateral pleural effusions and bibasilar airspace opacity which may reflect pulmonary edema or multifocal pneumonia per the radiologist.  There was some concern about new onset congestive heart failure. On call cardiologist at [facility], Dr. Radford Pax, was called by the ED P. Dr. Christy Gentles for possible transfer. This was declined. Therefore,a CT of her chest ordered to decipher whether she had definitive pneumonia or new onset congestive heart failure. The CT of her chest with contrast revealed multi-lobar pneumonia, moderate sized bilateral pleural effusions and a small pericardial effusion.   Hospital Course:  Healthcare associated multilobar pneumonia. Admitted and provided with IV antibiotics and supportive therapy. She completed a course of antibiotics. Strep pneumo antigen and Legionella antigen both negative. At discharge normal respiratory effort, afebrile and non-toxic appearing.   Bilateral pleural effusions. These could be parapneumonic or transudative.   Small pericardial effusion per CT and  confirmed with echo. ECG with sinus tach. Evaluated by Dr Harl Bowie with cardiology who recommended lasix and coreg. Lasix ultimately discontinued. Will be discharged on Coreg. Has follow up  appointment 08/27/14.   Type 2 diabetes mellitus. The patient was diagnosed with diabetes mellitus in 2005 and takes metformin at home. HgA1c 9.4. Resume home regimen at discharge. Will clearly need close OP follow up for improved control.   Normocytic anemia. Stable. Iron 35, TIBC 207 RBC 3.06 otherwise anemia panel within the limits of normal. Consider iron supplement.   Acute systolic HF: elevated BNP echo with EF 20-25%. May be related to #1 and #2. Remains euvolemic at this time. Evaluated by cardiology and started on coreg. Cardiology opines low likelihood of ischemic cardiomyopathy and therefore no plans for ischemic testing at this time. Likely viral. Follow up with cards 08/27/14.   Nausea/vomiting. Persistented. Ultrasound of RUQ is unremarkable. EGD unremarkable. Lipase and LFTs were normal. She was started on reglan for possible gastroparesis. She was provided scheduled zofran. CT abdomen revealed heavy stool burden. Provided suppository and Mirilax. Fair results. At discharge able to tolerate soft foods. Educated to constipation and management thereof as well as diet choices to avoid constipation   Acute kidney injury: little improvement. Likely multifactorial i.e. Vancomycin induced, contrast dye, diuresis. Appreciate nephrology input. provided IV fluids and nephrotoxins held. Creatinine slowly improving at discharge. Will follow up with Dr Hinda Lenis in 4 weeks. Recommend BMET in 1-2 weeks to track creatinine and potassium level.   Trichomonas: in urine. Treated with Flagyl. Counseled to notify partners       Procedures: EGD 08/16/14.  Normal esophagus. Stomach empty. Normal-appearing gastric mucosa.  Patent pylorus. Normal first and second portion of the duodenum.  I continue to suspect her nausea and vomiting is secondary to  non--GI systemic illness. She is tolerating Reglan well and seems  to have improved some in the past 24 hours.  Echo 08/08/14.  The cavity size was mildly  dilated. Wall thickness was normal. Systolic function was severely reduced. The estimated ejection fraction was in the range of 20% to 25%. - Mitral valve: There was mild regurgitation. - Pericardium, extracardiac: A small pericardial effusion was identified.     Consultations:  [facility]  cardiology  Discharge Exam: Filed Vitals:   08/19/14 0745  BP: 116/78  Pulse: 100  Temp: 98.3 F (36.8 C)  Resp: 16     General: obese appears very comfortable. Smiling/laughin  Cardiovascular: RRR No mgr no LE edema  Respiratory: normal effort BS clear bilaterally no wheeze   Discharge Instructions You were cared for by a hospitalist during your hospital stay. If you have any questions about your discharge medications or the care you received while you were in the hospital after you are discharged, you can call the unit and asked to speak with the hospitalist on call if the hospitalist that took care of you is not available. Once you are discharged, your primary care physician will handle any further medical issues. Please note that NO REFILLS for any discharge medications will be authorized once you are discharged, as it is imperative that you return to your primary care physician (or establish a relationship with a primary care physician if you do not have one) for your aftercare needs so that they can reassess your need for medications and monitor your lab values.  Discharge Instructions   Diet - low sodium heart healthy    Complete by:  As directed  Discharge instructions    Complete by:  As directed   Take medications as directed.  Follow up with MD's as schedules See PCP 1-2 weeks for evaluation of symptoms. Recommend BMET to track kidney function and monitor potassium level     Increase activity slowly    Complete by:  As directed           Current Discharge Medication List    START taking these medications   Details  carvedilol (COREG) 6.25 MG tablet Take 1  tablet (6.25 mg total) by mouth 2 (two) times daily with a meal. Qty: 60 tablet, Refills: 0    docusate sodium (COLACE) 100 MG capsule Take 1 capsule (100 mg total) by mouth 2 (two) times daily. Qty: 10 capsule, Refills: 0    metoCLOPramide (REGLAN) 10 MG tablet Take 1 tablet (10 mg total) by mouth 3 (three) times daily before meals. Qty: 42 tablet, Refills: 0    pantoprazole (PROTONIX) 20 MG tablet Take 1 tablet (20 mg total) by mouth daily. Qty: 30 tablet, Refills: 0      CONTINUE these medications which have NOT CHANGED   Details  acetaminophen (TYLENOL) 500 MG tablet Take 1,000 mg by mouth daily as needed for moderate pain.    albuterol (PROAIR HFA) 108 (90 BASE) MCG/ACT inhaler Inhale 2 puffs into the lungs daily as needed for wheezing or shortness of breath.     metFORMIN (GLUCOPHAGE) 500 MG tablet Take 500 mg by mouth daily.    naproxen sodium (ANAPROX) 220 MG tablet Take 220 mg by mouth 2 (two) times daily as needed (pain).       No Known Allergies Follow-up Information   Follow up with [facility] S, MD In 4 weeks.   Specialty:  Nephrology   Contact information:   [address] [address] [phone]       Follow up with Jory Sims, NP On 08/27/2014. (appointment at 3:10)    Specialty:  Nurse Practitioner   Contact information:   [place] [id number] [phone]       Follow up with PCP in [place]. followup 1 -2 weeks for evaluation of symtoms. recommend BMEt to track kidney function and potassium levle.       The results of significant diagnostics from this hospitalization (including imaging, microbiology, ancillary and laboratory) are listed below for reference.    Significant Diagnostic Studies: Ct Abdomen Pelvis Wo Contrast  08/17/2014   CLINICAL DATA:  Upper abdominal pain. Nausea, vomiting. Go to contrast Plain films 08/12/2014  EXAM: CT ABDOMEN AND PELVIS WITHOUT CONTRAST  TECHNIQUE: Multidetector CT  imaging of the abdomen and pelvis was performed following the standard protocol without IV contrast.  COMPARISON:  Plain films 08/12/2014  FINDINGS: There are small bilateral pleural effusions and pericardial effusion. Heart is mildly enlarged. Linear atelectasis in the left lung base.  Liver, gallbladder, spleen, pancreas, adrenals and kidneys have an unremarkable unenhanced appearance. Stomach, large and small bowel are unremarkable. Aorta is normal caliber. No free fluid, free air or adenopathy.  Uterus, right ovary and urinary bladder have a grossly unremarkable unenhanced appearance. Low-density area within the rib left ovary/adnexa measuring approximately 4 cm, likely cyst. This cannot be characterized on this noncontrast CT.  No acute bony abnormality.  IMPRESSION: No acute findings in the abdomen or pelvis on this unenhanced CT.  Small bilateral pleural effusions and pericardial effusion. Heart borderline enlarged.  4 cm low-density area within the left ovary/adnexae, likely cyst  although this cannot be fully characterized on this noncontrast CT.   Electronically Signed   By: Rolm Baptise [name].D.   On: 08/17/2014 11:47   Dg Chest 2 View  08/08/2014   CLINICAL DATA:  Cough, congestion, nausea, vomiting and chills.  EXAM: CHEST  2 VIEW  COMPARISON:  None.  FINDINGS: The lungs are well-aerated. Small bilateral pleural effusions are seen. Bibasilar airspace opacification may reflect pulmonary edema or multifocal pneumonia. No pneumothorax is seen.  The heart is borderline normal in size. No acute osseous abnormalities are seen.  IMPRESSION: Small bilateral pleural effusions seen. Bibasilar airspace opacification may reflect pulmonary edema or multifocal pneumonia.   Electronically Signed   By: Garald Balding [name].D.   On: 08/08/2014 04:42   Ct Chest W Contrast  08/08/2014   CLINICAL DATA:  Cough and right chest pain. Bibasilar airspace opacity and small bilateral pleural effusions on chest radiographs earlier  today.  EXAM: CT CHEST WITH CONTRAST  TECHNIQUE: Multidetector CT imaging of the chest was performed during intravenous contrast administration.  CONTRAST:  59mL OMNIPAQUE IOHEXOL 300 MG/ML  SOLN  COMPARISON:  Chest radiographs obtained earlier today.  FINDINGS: Moderate-sized bilateral pleural effusions. Small pericardial effusion with a maximum thickness of 1.2 cm. Patchy and confluent airspace opacity in both lower lobes and in the right middle lobe. Mild thoracic spine degenerative changes. Unremarkable upper abdomen.  IMPRESSION: 1. Multilobar pneumonia. 2. Moderate-sized bilateral pleural effusions. 3. Small pericardial effusion.   Electronically Signed   By: Enrique Sack [name].D.   On: 08/08/2014 08:07   US Renal  08/11/2014   CLINICAL DATA:  Acute kidney injury and renal failure.  EXAM: RENAL/URINARY TRACT ULTRASOUND COMPLETE  COMPARISON:  None.  FINDINGS: Right Kidney:  Length: 13.0. Echogenicity within normal limits. No mass or hydronephrosis visualized.  Left Kidney:  Length: 11.5. Echogenicity within normal limits. No mass or hydronephrosis visualized.  Bladder:  Appears normal for degree of bladder distention.  IMPRESSION: Normal renal ultrasound.   Electronically Signed   By: Aletta Edouard [name].D.   On: 08/11/2014 11:00   Dg Abd 2 Views  08/12/2014   CLINICAL DATA:  Nausea, vomiting.  Abdominal pain.  EXAM: ABDOMEN - 2 VIEW  COMPARISON:  Abdominal radiograph 08/09/2014  FINDINGS: Leads overlie the patient. Small bilateral pleural effusions and heterogeneous opacities within the lung bases. Relative paucity of small bowel gas. No definite evidence for small bowel obstruction. Regional skeleton is unremarkable.  IMPRESSION: Paucity of small bowel gas.  No definite evidence for obstruction.  Bilateral pleural effusions and heterogeneous opacities within the bilateral lung bases.   Electronically Signed   By: Lovey Newcomer [name].D.   On: 08/12/2014 19:18   Dg Abd 2 Views  08/09/2014   CLINICAL DATA:  Upper  abdominal pain.  EXAM: ABDOMEN - 2 VIEW  COMPARISON:  None.  FINDINGS: The bowel gas pattern is normal. There is no evidence of free air. No radio-opaque calculi or other significant radiographic abnormality is seen. Mild amount of stool burden is noted in the right colon.  IMPRESSION: No evidence of bowel obstruction or ileus is noted.   Electronically Signed   By: Sabino Dick [name].D.   On: 08/09/2014 16:26   US Abdomen Limited Ruq  08/13/2014   CLINICAL DATA:  Persistent nausea, vomiting  EXAM: US ABDOMEN LIMITED - RIGHT UPPER QUADRANT  COMPARISON:  None.  FINDINGS: Gallbladder:  No gallstones or wall thickening visualized. No sonographic Murphy sign noted.  Common bile duct:  Diameter: 3.2 mm  Liver:  No focal lesion identified. Within normal limits in parenchymal echogenicity.  IMPRESSION: Unremarkable right upper quadrant ultrasound.   Electronically Signed   By: Lahoma Crocker [name].D.   On: 08/13/2014 09:45    Microbiology: Recent Results (from the past 240 hour(s))  URINE CULTURE     Status: None   Collection Time    08/12/14 10:00 PM      Result Value Ref Range Status   Specimen Description URINE, RANDOM   Final   Special Requests NONE   Final   Culture  Setup Time     Final   Value: 08/13/2014 13:24     Performed at [facility]     Final   Value: 10,000 COLONIES/ML     Performed at [facility]   Culture     Final   Value: YEAST     Performed at [facility]   Report Status 08/15/2014 FINAL   Final     Labs: Basic Metabolic Panel:  Recent Labs Lab 08/16/14 [id number] 08/17/14 [id number] 08/17/14 [id number] 08/18/14 0551 08/18/14 2205 08/19/14 0637  NA 140  --  138 138 136* 141  K 3.7  --  3.1* 3.1* 3.5* 3.2*  CL 99  --  99 98 97 102  CO2 30  --  29 30 27 29   GLUCOSE 146*  --  150* 108* 118* 130*  BUN 14  --  14 12 13 12   CREATININE 2.26*  --  2.07* 2.03* 1.88* 1.91*  CALCIUM 8.5  --  8.0* 8.3* 8.6 8.3*  MG  --  1.5  --   --  1.6  --    Liver  Function Tests:  Recent Labs Lab 08/13/14 0607 08/18/14 2205  AST 26 17  ALT 15 17  ALKPHOS 66 61  BILITOT 0.5 0.6  PROT 6.9 6.8  ALBUMIN 2.8* 3.0*    Recent Labs Lab 08/13/14 0607  LIPASE 45   No results found for this basename: AMMONIA,  in the last 168 hours CBC:  Recent Labs Lab 08/13/14 0607 08/18/14 0551 08/19/14 0637  WBC 9.7 11.3* 10.8*  HGB 10.3* 8.6* 8.3*  HCT 30.0* 24.8* 24.3*  MCV 84.5 84.1 84.4  PLT 374 263 218   Cardiac Enzymes: No results found for this basename: CKTOTAL, CKMB, CKMBINDEX, TROPONINI,  in the last 168 hours BNP: BNP (last 3 results)  Recent Labs  08/08/14 0456  PROBNP 2629.0*   CBG:  Recent Labs Lab 08/18/14 0759 08/18/14 1120 08/18/14 2103 08/19/14 0733 08/19/14 1142  GLUCAP 104* 112* 120* 140* 111*       Signed:  Malikah Principato [name]  [facility] 08/19/2014, 2:16 PM

## 2014-08-19 NOTE — Progress Notes (Signed)
UR chart review completed.  

## 2014-08-19 NOTE — Plan of Care (Signed)
Problem: Food- and Nutrition-Related Knowledge Deficit (NB-1.1) Goal: Nutrition education Formal process to instruct or train a patient/client in a skill or to impart knowledge to help patients/clients voluntarily manage or modify food choices and eating behavior to maintain or improve health. Outcome: Adequate for Discharge  RD consulted for nutrition education regarding constipation.     Lab Results  Component Value Date    HGBA1C 9.4* 08/08/2014    RD provided "Constipation Nutrition Therapy" handout from the [employer] of [employer]. Educated pt about high fiber food sources. Encouraged pt to slowly increase fiber into her diet, while increasing fluid intake to prevent further constipation. Also discussed importance of increased fiber consumption assist with glycemic control. Teach back method used.  Expect fair compliance.  Body mass index is 35.64 kg/(m^2). Pt meets criteria for obesity, class II based on current BMI.  Current diet order is soft, patient is consuming approximately 25-100% of meals at this time. Labs and medications reviewed. No further nutrition interventions warranted at this time. RD contact information provided. If additional nutrition issues arise, please re-consult RD.  Diana [name] A. Jimmye Norman, RD, LDN Pager: [phone]

## 2014-08-19 NOTE — Progress Notes (Signed)
Pt discharged to home. Pt educated on discharge paperwork and understands when to call MD or 911 for emergency.

## 2014-08-19 NOTE — Discharge Summary (Cosign Needed)
Physician Discharge Summary  Israela Kelting [id number] DOB: 06-18-1986 DOA: 08/08/2014  PCP: Pcp Not In System  Admit date: 08/08/2014 Discharge date: 08/19/2014  Time spent: 40 minutes  Recommendations for Outpatient Follow-up:  1. Follow up with Cardiology 08/27/14 as scheduled for evaluation of acute systolic HF. Started on Coreg. No standing diuretic at discharge, no ACE inhibitor or ARB due to renal insufficiency ( that is improving at discharge).  2. Follow up with PCP1-2 weeks for evaluation of symptoms. Recommend BMET to track kidney function and potassium level. Also recommend close OP follow up for uncontrolled diabetes 3. Dr Hinda Lenis in 4 weeks  Discharge Diagnoses:  Principal Problem:   HCAP (healthcare-associated pneumonia) Active Problems:   Pleural effusion, bilateral   Pericardial effusion   Type II or unspecified type diabetes mellitus without mention of complication, not stated as uncontrolled   Anemia, unspecified   Nausea with vomiting   Acute systolic heart failure   Acute renal failure   Sinus tachycardia   Obesity, unspecified   Trichomonas infection   Unspecified constipation   Discharge Condition: stable  Diet recommendation: carb modified  Filed Weights   08/15/14 0529 08/16/14 0601 08/17/14 1056  Weight: 90.357 kg (199 lb 3.2 oz) 89.495 kg (197 lb 4.8 oz) 88.406 kg (194 lb 14.4 oz)    History of present illness:  Diana [name] is a 28 y.o. female with a history of diabetes mellitus, who presented to the emergency department with a chief complaint of shortness of breath and cough. She had been short of breath for more than one week, mostly with any type of activity. She had associated orthopnea and found it difficult to lay flat when sleeping. She had some pleurisy, mostly on the right. No other chest pain. Her cough had been intermittently productive with yellow sputum. She had chills but no fever. She had 2 episodes of nausea and vomiting  without coffee grounds emesis. She denied diarrhea or abdominal pain. She denied pain with urination. She had intermittent swelling in both legs which she attributed to standing on her feet at work. She had a recent stuffy nose and a frontal headache. She denied tobacco and illicit drug use. She had no prior history of heart disease or congestive heart failure.   In the emergency department, she was afebrile and sinus tachycardia, but her blood pressure was within normal limits. Her white blood cell count was within normal limits at 8.9. Her lab data were significant for a pro BNP of 2629 and a venous glucose of 205. Her urine pregnancy test was negative. Her chest x-ray revealed small bilateral pleural effusions and bibasilar airspace opacity which may reflect pulmonary edema or multifocal pneumonia per the radiologist.  There was some concern about new onset congestive heart failure. On call cardiologist at [facility], Dr. Radford Pax, was called by the ED P. Dr. Christy Gentles for possible transfer. This was declined. Therefore,a CT of her chest ordered to decipher whether she had definitive pneumonia or new onset congestive heart failure. The CT of her chest with contrast revealed multi-lobar pneumonia, moderate sized bilateral pleural effusions and a small pericardial effusion.   Hospital Course:  Healthcare associated multilobar pneumonia. Admitted and provided with IV antibiotics and supportive therapy. She completed a course of antibiotics. Strep pneumo antigen and Legionella antigen both negative. At discharge normal respiratory effort, afebrile and non-toxic appearing.   Bilateral pleural effusions. These could be parapneumonic or transudative.   Small pericardial effusion per CT and confirmed with echo. ECG  with sinus tach. Evaluated by Dr Harl Bowie with cardiology who recommended lasix and coreg. Lasix ultimately discontinued. Will be discharged on Coreg. Has follow up appointment 08/27/14.    Type 2  diabetes mellitus. The patient was diagnosed with diabetes mellitus in 2005 and takes metformin at home. HgA1c 9.4. Resume home regimen at discharge.  Will clearly need close OP follow up for improved control.   Normocytic anemia. Stable. Iron 35, TIBC 207 RBC 3.06 otherwise anemia panel within the limits of normal. Consider iron supplement.   Acute systolic HF: elevated BNP echo with EF 20-25%. May be related to #1 and #2. Remains euvolemic at this time. Evaluated by cardiology and started on coreg. Cardiology opines low likelihood of ischemic cardiomyopathy and therefore no plans for ischemic testing at this time. Likely viral. Follow up with cards 08/27/14.  Nausea/vomiting. Persistented. Ultrasound of RUQ is unremarkable. EGD unremarkable.  Lipase and LFTs were normal. She was started on reglan for possible gastroparesis. She was provided scheduled zofran. CT abdomen revealed heavy stool burden. Provided suppository and Mirilax. Fair results. At discharge able to tolerate soft foods. Educated to constipation and management thereof as well as diet choices to avoid constipation   Acute kidney injury: little improvement. Likely multifactorial i.e. Vancomycin induced, contrast dye, diuresis. Appreciate nephrology input. provided IV fluids and nephrotoxins held. Creatinine slowly improving at discharge. Will follow up with Dr Hinda Lenis in 4 weeks. Recommend BMET in 1-2 weeks to track creatinine and potassium level.    Trichomonas: in urine. Treated with Flagyl. Counseled to notify partners      Procedures: EGD 08/16/14.  Normal esophagus. Stomach empty. Normal-appearing gastric mucosa.  Patent pylorus. Normal first and second portion of the duodenum.  I continue to suspect her nausea and vomiting is secondary to  non--GI systemic illness. She is tolerating Reglan well and seems  to have improved some in the past 24 hours.  Echo 08/08/14.  The cavity size was mildly dilated. Wall thickness was  normal. Systolic function was severely reduced. The estimated ejection fraction was in the range of 20% to 25%. - Mitral valve: There was mild regurgitation. - Pericardium, extracardiac: A small pericardial effusion was identified.   Consultations:  GI  Nephrology  cardiology  Discharge Exam: Filed Vitals:   08/19/14 0745  BP: 116/78  Pulse: 100  Temp: 98.3 F (36.8 C)  Resp: 16    General: obese appears very comfortable. Smiling/laughin Cardiovascular: RRR No mgr no LE edema Respiratory: normal effort BS clear bilaterally no wheeze  Discharge Instructions You were cared for by a hospitalist during your hospital stay. If you have any questions about your discharge medications or the care you received while you were in the hospital after you are discharged, you can call the unit and asked to speak with the hospitalist on call if the hospitalist that took care of you is not available. Once you are discharged, your primary care physician will handle any further medical issues. Please note that NO REFILLS for any discharge medications will be authorized once you are discharged, as it is imperative that you return to your primary care physician (or establish a relationship with a primary care physician if you do not have one) for your aftercare needs so that they can reassess your need for medications and monitor your lab values.  Discharge Instructions   Diet - low sodium heart healthy    Complete by:  As directed      Discharge instructions    Complete  by:  As directed   Take medications as directed.  Follow up with MD's as schedules See PCP 1-2 weeks for evaluation of symptoms. Recommend BMET to track kidney function and monitor potassium level     Increase activity slowly    Complete by:  As directed           Current Discharge Medication List    START taking these medications   Details  carvedilol (COREG) 6.25 MG tablet Take 1 tablet (6.25 mg total) by mouth 2 (two)  times daily with a meal. Qty: 60 tablet, Refills: 0    docusate sodium (COLACE) 100 MG capsule Take 1 capsule (100 mg total) by mouth 2 (two) times daily. Qty: 10 capsule, Refills: 0    metoCLOPramide (REGLAN) 10 MG tablet Take 1 tablet (10 mg total) by mouth 3 (three) times daily before meals. Qty: 42 tablet, Refills: 0    morphine 2 MG/ML injection Inject 1 mL (2 mg total) into the vein every 4 (four) hours as needed. Qty: 1 mL, Refills: 0    pantoprazole (PROTONIX) 20 MG tablet Take 1 tablet (20 mg total) by mouth daily. Qty: 30 tablet, Refills: 0      CONTINUE these medications which have NOT CHANGED   Details  acetaminophen (TYLENOL) 500 MG tablet Take 1,000 mg by mouth daily as needed for moderate pain.    albuterol (PROAIR HFA) 108 (90 BASE) MCG/ACT inhaler Inhale 2 puffs into the lungs daily as needed for wheezing or shortness of breath.     metFORMIN (GLUCOPHAGE) 500 MG tablet Take 500 mg by mouth daily.    naproxen sodium (ANAPROX) 220 MG tablet Take 220 mg by mouth 2 (two) times daily as needed (pain).       No Known Allergies Follow-up Information   Follow up with [facility], MD In 4 weeks.   Specialty:  Nephrology   Contact information:   [address] [address] [phone]       Follow up with Jory Sims, NP On 08/27/2014. (appointment at 3:10)    Specialty:  Nurse Practitioner   Contact information:   [place] [address] [phone]       Follow up with PCP in [place]. followup 1 -2 weeks for evaluation of symtoms. recommend BMEt to track kidney function and potassium levle.       The results of significant diagnostics from this hospitalization (including imaging, microbiology, ancillary and laboratory) are listed below for reference.    Significant Diagnostic Studies: Ct Abdomen Pelvis Wo Contrast  08/17/2014   CLINICAL DATA:  Upper abdominal pain. Nausea, vomiting. Go to contrast Plain films  08/12/2014  EXAM: CT ABDOMEN AND PELVIS WITHOUT CONTRAST  TECHNIQUE: Multidetector CT imaging of the abdomen and pelvis was performed following the standard protocol without IV contrast.  COMPARISON:  Plain films 08/12/2014  FINDINGS: There are small bilateral pleural effusions and pericardial effusion. Heart is mildly enlarged. Linear atelectasis in the left lung base.  Liver, gallbladder, spleen, pancreas, adrenals and kidneys have an unremarkable unenhanced appearance. Stomach, large and small bowel are unremarkable. Aorta is normal caliber. No free fluid, free air or adenopathy.  Uterus, right ovary and urinary bladder have a grossly unremarkable unenhanced appearance. Low-density area within the rib left ovary/adnexa measuring approximately 4 cm, likely cyst. This cannot be characterized on this noncontrast CT.  No acute bony abnormality.  IMPRESSION: No acute findings in the abdomen or pelvis on this unenhanced CT.  Small bilateral pleural effusions and pericardial effusion. Heart borderline enlarged.  4 cm low-density area within the left ovary/adnexae, likely cyst although this cannot be fully characterized on this noncontrast CT.   Electronically Signed   By: Rolm Baptise M.D.   On: 08/17/2014 11:47   Dg Chest 2 View  08/08/2014   CLINICAL DATA:  Cough, congestion, nausea, vomiting and chills.  EXAM: CHEST  2 VIEW  COMPARISON:  None.  FINDINGS: The lungs are well-aerated. Small bilateral pleural effusions are seen. Bibasilar airspace opacification may reflect pulmonary edema or multifocal pneumonia. No pneumothorax is seen.  The heart is borderline normal in size. No acute osseous abnormalities are seen.  IMPRESSION: Small bilateral pleural effusions seen. Bibasilar airspace opacification may reflect pulmonary edema or multifocal pneumonia.   Electronically Signed   By: Garald Balding M.D.   On: 08/08/2014 04:42   Ct Chest W Contrast  08/08/2014   CLINICAL DATA:  Cough and right chest pain. Bibasilar  airspace opacity and small bilateral pleural effusions on chest radiographs earlier today.  EXAM: CT CHEST WITH CONTRAST  TECHNIQUE: Multidetector CT imaging of the chest was performed during intravenous contrast administration.  CONTRAST:  15mL OMNIPAQUE IOHEXOL 300 MG/ML  SOLN  COMPARISON:  Chest radiographs obtained earlier today.  FINDINGS: Moderate-sized bilateral pleural effusions. Small pericardial effusion with a maximum thickness of 1.2 cm. Patchy and confluent airspace opacity in both lower lobes and in the right middle lobe. Mild thoracic spine degenerative changes. Unremarkable upper abdomen.  IMPRESSION: 1. Multilobar pneumonia. 2. Moderate-sized bilateral pleural effusions. 3. Small pericardial effusion.   Electronically Signed   By: Enrique Sack M.D.   On: 08/08/2014 08:07   US Renal  08/11/2014   CLINICAL DATA:  Acute kidney injury and renal failure.  EXAM: RENAL/URINARY TRACT ULTRASOUND COMPLETE  COMPARISON:  None.  FINDINGS: Right Kidney:  Length: 13.0. Echogenicity within normal limits. No mass or hydronephrosis visualized.  Left Kidney:  Length: 11.5. Echogenicity within normal limits. No mass or hydronephrosis visualized.  Bladder:  Appears normal for degree of bladder distention.  IMPRESSION: Normal renal ultrasound.   Electronically Signed   By: Aletta Edouard M.D.   On: 08/11/2014 11:00   Dg Abd 2 Views  08/12/2014   CLINICAL DATA:  Nausea, vomiting.  Abdominal pain.  EXAM: ABDOMEN - 2 VIEW  COMPARISON:  Abdominal radiograph 08/09/2014  FINDINGS: Leads overlie the patient. Small bilateral pleural effusions and heterogeneous opacities within the lung bases. Relative paucity of small bowel gas. No definite evidence for small bowel obstruction. Regional skeleton is unremarkable.  IMPRESSION: Paucity of small bowel gas.  No definite evidence for obstruction.  Bilateral pleural effusions and heterogeneous opacities within the bilateral lung bases.   Electronically Signed   By: Lovey Newcomer  M.D.   On: 08/12/2014 19:18   Dg Abd 2 Views  08/09/2014   CLINICAL DATA:  Upper abdominal pain.  EXAM: ABDOMEN - 2 VIEW  COMPARISON:  None.  FINDINGS: The bowel gas pattern is normal. There is no evidence of free air. No radio-opaque calculi or other significant radiographic abnormality is seen. Mild amount of stool burden is noted in the right colon.  IMPRESSION: No evidence of bowel obstruction or ileus is noted.   Electronically Signed   By: Sabino Dick M.D.   On: 08/09/2014 16:26   US Abdomen Limited Ruq  08/13/2014   CLINICAL DATA:  Persistent nausea, vomiting  EXAM: US ABDOMEN LIMITED - RIGHT UPPER QUADRANT  COMPARISON:  None.  FINDINGS: Gallbladder:  No gallstones or wall thickening visualized. No sonographic Murphy sign noted.  Common bile duct:  Diameter: 3.2 mm  Liver:  No focal lesion identified. Within normal limits in parenchymal echogenicity.  IMPRESSION: Unremarkable right upper quadrant ultrasound.   Electronically Signed   By: Lahoma Crocker M.D.   On: 08/13/2014 09:45    Microbiology: Recent Results (from the past 240 hour(s))  URINE CULTURE     Status: None   Collection Time    08/12/14 10:00 PM      Result Value Ref Range Status   Specimen Description URINE, RANDOM   Final   Special Requests NONE   Final   Culture  Setup Time     Final   Value: 08/13/2014 13:24     Performed at [facility]     Final   Value: 10,000 COLONIES/ML     Performed at [facility]   Culture     Final   Value: YEAST     Performed at [facility]   Report Status 08/15/2014 FINAL   Final     Labs: Basic Metabolic Panel:  Recent Labs Lab 08/16/14 [id number] 08/17/14 [id number] 08/17/14 FY:[id number] 08/18/14 0551 08/18/14 2205 08/19/14 0637  NA 140  --  138 138 136* 141  K 3.7  --  3.1* 3.1* 3.5* 3.2*  CL 99  --  99 98 97 102  CO2 30  --  29 30 27 29   GLUCOSE 146*  --  150* 108* 118* 130*  BUN 14  --  14 12 13 12   CREATININE 2.26*  --  2.07* 2.03* 1.88* 1.91*   CALCIUM 8.5  --  8.0* 8.3* 8.6 8.3*  MG  --  1.5  --   --  1.6  --    Liver Function Tests:  Recent Labs Lab 08/13/14 0607 08/18/14 2205  AST 26 17  ALT 15 17  ALKPHOS 66 61  BILITOT 0.5 0.6  PROT 6.9 6.8  ALBUMIN 2.8* 3.0*    Recent Labs Lab 08/13/14 0607  LIPASE 45   No results found for this basename: AMMONIA,  in the last 168 hours CBC:  Recent Labs Lab 08/13/14 0607 08/18/14 0551 08/19/14 0637  WBC 9.7 11.3* 10.8*  HGB 10.3* 8.6* 8.3*  HCT 30.0* 24.8* 24.3*  MCV 84.5 84.1 84.4  PLT 374 263 218   Cardiac Enzymes: No results found for this basename: CKTOTAL, CKMB, CKMBINDEX, TROPONINI,  in the last 168 hours BNP: BNP (last 3 results)  Recent Labs  08/08/14 0456  PROBNP 2629.0*   CBG:  Recent Labs Lab 08/18/14 0759 08/18/14 1120 08/18/14 2103 08/19/14 0733 08/19/14 1142  GLUCAP 104* 112* 120* 140* 111*       Signed:  Renad Jenniges M  [facility] 08/19/2014, 1:45 PM

## 2014-08-19 NOTE — Discharge Summary (Signed)
Pt seen and examined. Agree with above discharge summary. Briefly, pt presents with sob with abd pains and nausea/vomiting. Pt was found to have an elevated BNP with follow up 2d echo revealing an EF of 20-25%. The patient was seen by Cardiology with recs to cont on coreg with plans for follow up with Cardiology on discharge and strong considerations for viral cardiomyopathy. The patient was also noted to have evidence of PNA on CT, completing a course of IV abx.The patient also was noted to have continued abd pain and nausea. GI was consulted. The patient was found to have stool throughout the colon. Multiple cathartics were ordered and the patient ultimately felt improved. In addition, the patient was noted to have acute renal failure with eventual improvement in renal function and recommendations for repeat BMET in 1-2 weeks. Pt was also treated with trichomonas in her urine with flagyl. Stable for discharge home today with close outpatient follow up.

## 2014-08-22 ENCOUNTER — Inpatient Hospital Stay (HOSPITAL_COMMUNITY)
Admission: EM | Admit: 2014-08-22 | Discharge: 2014-08-29 | DRG: 074 | Disposition: A | Discharge status: Home / Self Care | Payer: BC Managed Care – PPO | Attending: Internal Medicine | Admitting: Internal Medicine

## 2014-08-22 ENCOUNTER — Emergency Department (HOSPITAL_COMMUNITY): Payer: BC Managed Care – PPO

## 2014-08-22 ENCOUNTER — Encounter (HOSPITAL_COMMUNITY): Payer: Self-pay | Admitting: Emergency Medicine

## 2014-08-22 DIAGNOSIS — E876 Hypokalemia: Secondary | ICD-10-CM

## 2014-08-22 DIAGNOSIS — R1110 Vomiting, unspecified: Secondary | ICD-10-CM

## 2014-08-22 DIAGNOSIS — E669 Obesity, unspecified: Secondary | ICD-10-CM

## 2014-08-22 DIAGNOSIS — E1143 Type 2 diabetes mellitus with diabetic autonomic (poly)neuropathy: Principal | ICD-10-CM | POA: Diagnosis present

## 2014-08-22 DIAGNOSIS — R112 Nausea with vomiting, unspecified: Secondary | ICD-10-CM | POA: Diagnosis not present

## 2014-08-22 DIAGNOSIS — K3184 Gastroparesis: Secondary | ICD-10-CM | POA: Diagnosis present

## 2014-08-22 DIAGNOSIS — I509 Heart failure, unspecified: Secondary | ICD-10-CM | POA: Diagnosis present

## 2014-08-22 DIAGNOSIS — N184 Chronic kidney disease, stage 4 (severe): Secondary | ICD-10-CM

## 2014-08-22 DIAGNOSIS — E1121 Type 2 diabetes mellitus with diabetic nephropathy: Secondary | ICD-10-CM | POA: Diagnosis present

## 2014-08-22 DIAGNOSIS — E1122 Type 2 diabetes mellitus with diabetic chronic kidney disease: Secondary | ICD-10-CM

## 2014-08-22 DIAGNOSIS — E118 Type 2 diabetes mellitus with unspecified complications: Secondary | ICD-10-CM | POA: Diagnosis present

## 2014-08-22 DIAGNOSIS — K5909 Other constipation: Secondary | ICD-10-CM

## 2014-08-22 DIAGNOSIS — K5900 Constipation, unspecified: Secondary | ICD-10-CM

## 2014-08-22 LAB — URINE MICROSCOPIC-ADD ON

## 2014-08-22 LAB — URINALYSIS, ROUTINE W REFLEX MICROSCOPIC
Bilirubin Urine: NEGATIVE
Glucose, UA: 100 mg/dL — AB
Ketones, ur: NEGATIVE mg/dL
Leukocytes, UA: NEGATIVE
Nitrite: NEGATIVE
Protein, ur: 100 mg/dL — AB
Specific Gravity, Urine: 1.015 (ref 1.005–1.030)
Urobilinogen, UA: 0.2 mg/dL (ref 0.0–1.0)
pH: 7 (ref 5.0–8.0)

## 2014-08-22 LAB — PREGNANCY, URINE: Preg Test, Ur: NEGATIVE

## 2014-08-22 MED ORDER — ONDANSETRON 8 MG PO TBDP
8.0000 mg | ORAL_TABLET | Freq: Once | ORAL | Status: AC
  Administered 2014-08-22: 8 mg via ORAL
  Filled 2014-08-22: qty 1

## 2014-08-22 NOTE — ED Notes (Signed)
Pt c/o n/v x 3 days

## 2014-08-22 NOTE — ED Notes (Signed)
Pt reports nausea and vomiting "for some time". Pt states she vomited last time 1 hour ago. Also c/o SOB pt O2 sats are at 98%

## 2014-08-23 DIAGNOSIS — R1110 Vomiting, unspecified: Secondary | ICD-10-CM

## 2014-08-23 DIAGNOSIS — E1122 Type 2 diabetes mellitus with diabetic chronic kidney disease: Secondary | ICD-10-CM | POA: Diagnosis present

## 2014-08-23 DIAGNOSIS — K5900 Constipation, unspecified: Secondary | ICD-10-CM | POA: Diagnosis present

## 2014-08-23 DIAGNOSIS — E876 Hypokalemia: Secondary | ICD-10-CM

## 2014-08-23 DIAGNOSIS — E669 Obesity, unspecified: Secondary | ICD-10-CM

## 2014-08-23 DIAGNOSIS — R112 Nausea with vomiting, unspecified: Secondary | ICD-10-CM | POA: Diagnosis present

## 2014-08-23 DIAGNOSIS — E1143 Type 2 diabetes mellitus with diabetic autonomic (poly)neuropathy: Secondary | ICD-10-CM | POA: Diagnosis present

## 2014-08-23 DIAGNOSIS — K3184 Gastroparesis: Secondary | ICD-10-CM | POA: Diagnosis present

## 2014-08-23 DIAGNOSIS — E1121 Type 2 diabetes mellitus with diabetic nephropathy: Secondary | ICD-10-CM | POA: Diagnosis present

## 2014-08-23 DIAGNOSIS — I509 Heart failure, unspecified: Secondary | ICD-10-CM | POA: Diagnosis present

## 2014-08-23 DIAGNOSIS — N184 Chronic kidney disease, stage 4 (severe): Secondary | ICD-10-CM | POA: Diagnosis present

## 2014-08-23 LAB — CBC WITH DIFFERENTIAL/PLATELET
Basophils Absolute: 0 10*3/uL (ref 0.0–0.1)
Basophils Relative: 0 % (ref 0–1)
Eosinophils Absolute: 0.2 10*3/uL (ref 0.0–0.7)
Eosinophils Relative: 2 % (ref 0–5)
HCT: 27.5 % — ABNORMAL LOW (ref 36.0–46.0)
Hemoglobin: 9.6 g/dL — ABNORMAL LOW (ref 12.0–15.0)
Lymphocytes Relative: 17 % (ref 12–46)
Lymphs Abs: 1.6 10*3/uL (ref 0.7–4.0)
MCH: 29.3 pg (ref 26.0–34.0)
MCHC: 34.9 g/dL (ref 30.0–36.0)
MCV: 83.8 fL (ref 78.0–100.0)
Monocytes Absolute: 0.7 10*3/uL (ref 0.1–1.0)
Monocytes Relative: 8 % (ref 3–12)
Neutro Abs: 6.8 10*3/uL (ref 1.7–7.7)
Neutrophils Relative %: 73 % (ref 43–77)
Platelets: 245 10*3/uL (ref 150–400)
RBC: 3.28 MIL/uL — ABNORMAL LOW (ref 3.87–5.11)
RDW: 12 % (ref 11.5–15.5)
WBC: 9.4 10*3/uL (ref 4.0–10.5)

## 2014-08-23 LAB — HEMOGLOBIN A1C
Hgb A1c MFr Bld: 7.2 % — ABNORMAL HIGH (ref <5.7)
Mean Plasma Glucose: 160 mg/dL — ABNORMAL HIGH (ref <117)

## 2014-08-23 LAB — GLUCOSE, CAPILLARY
Glucose-Capillary: 137 mg/dL — ABNORMAL HIGH (ref 70–99)
Glucose-Capillary: 140 mg/dL — ABNORMAL HIGH (ref 70–99)
Glucose-Capillary: 215 mg/dL — ABNORMAL HIGH (ref 70–99)

## 2014-08-23 LAB — COMPREHENSIVE METABOLIC PANEL
ALT: 8 U/L (ref 0–35)
AST: 10 U/L (ref 0–37)
Albumin: 3.2 g/dL — ABNORMAL LOW (ref 3.5–5.2)
Alkaline Phosphatase: 68 U/L (ref 39–117)
Anion gap: 12 (ref 5–15)
BUN: 11 mg/dL (ref 6–23)
CO2: 30 mEq/L (ref 19–32)
Calcium: 8.8 mg/dL (ref 8.4–10.5)
Chloride: 97 mEq/L (ref 96–112)
Creatinine, Ser: 1.61 mg/dL — ABNORMAL HIGH (ref 0.50–1.10)
GFR calc Af Amer: 50 mL/min — ABNORMAL LOW (ref >90)
GFR calc non Af Amer: 43 mL/min — ABNORMAL LOW (ref >90)
Glucose, Bld: 158 mg/dL — ABNORMAL HIGH (ref 70–99)
Potassium: 3.1 mEq/L — ABNORMAL LOW (ref 3.7–5.3)
Sodium: 139 mEq/L (ref 137–147)
Total Bilirubin: 0.5 mg/dL (ref 0.3–1.2)
Total Protein: 7 g/dL (ref 6.0–8.3)

## 2014-08-23 LAB — CBG MONITORING, ED: Glucose-Capillary: 148 mg/dL — ABNORMAL HIGH (ref 70–99)

## 2014-08-23 LAB — CREATININE, SERUM

## 2014-08-23 MED ORDER — POTASSIUM CHLORIDE CRYS ER 20 MEQ PO TBCR
40.0000 meq | EXTENDED_RELEASE_TABLET | Freq: Two times a day (BID) | ORAL | Status: DC
  Filled 2014-08-23: qty 2

## 2014-08-23 MED ORDER — LINACLOTIDE 145 MCG PO CAPS
145.0000 ug | ORAL_CAPSULE | Freq: Every day | ORAL | Status: DC
  Administered 2014-08-23 – 2014-08-29 (×7): 145 ug via ORAL
  Filled 2014-08-23 (×7): qty 1

## 2014-08-23 MED ORDER — ENOXAPARIN SODIUM 40 MG/0.4ML ~~LOC~~ SOLN
40.0000 mg | SUBCUTANEOUS | Status: DC
  Administered 2014-08-23 – 2014-08-29 (×7): 40 mg via SUBCUTANEOUS
  Filled 2014-08-23 (×7): qty 0.4

## 2014-08-23 MED ORDER — ACETAMINOPHEN 500 MG PO TABS
1000.0000 mg | ORAL_TABLET | Freq: Every day | ORAL | Status: DC | PRN
  Administered 2014-08-28: 1000 mg via ORAL
  Filled 2014-08-23: qty 2

## 2014-08-23 MED ORDER — POTASSIUM CHLORIDE 10 MEQ/100ML IV SOLN
10.0000 meq | Freq: Once | INTRAVENOUS | Status: AC
  Administered 2014-08-23: 10 meq via INTRAVENOUS
  Filled 2014-08-23: qty 100

## 2014-08-23 MED ORDER — ALBUTEROL SULFATE (2.5 MG/3ML) 0.083% IN NEBU: 3.0000 mL | INHALATION_SOLUTION | Freq: Every day | RESPIRATORY_TRACT | Status: DC | PRN

## 2014-08-23 MED ORDER — ONDANSETRON HCL 4 MG/2ML IJ SOLN: 4.0000 mg | Freq: Four times a day (QID) | INTRAMUSCULAR | Status: DC | PRN

## 2014-08-23 MED ORDER — ACETAMINOPHEN 325 MG PO TABS: 650.0000 mg | ORAL_TABLET | Freq: Four times a day (QID) | ORAL | Status: DC | PRN

## 2014-08-23 MED ORDER — INSULIN ASPART 100 UNIT/ML ~~LOC~~ SOLN
0.0000 [IU] | Freq: Every day | SUBCUTANEOUS | Status: DC
Start: 2014-08-23 — End: 2014-08-29

## 2014-08-23 MED ORDER — POLYETHYLENE GLYCOL 3350 17 G PO PACK
17.0000 g | PACK | Freq: Every day | ORAL | Status: DC
  Administered 2014-08-23 – 2014-08-27 (×4): 17 g via ORAL
  Filled 2014-08-23 (×5): qty 1

## 2014-08-23 MED ORDER — INSULIN ASPART 100 UNIT/ML ~~LOC~~ SOLN
0.0000 [IU] | Freq: Three times a day (TID) | SUBCUTANEOUS | Status: DC
  Administered 2014-08-23 – 2014-08-28 (×11): 1 [IU] via SUBCUTANEOUS
  Administered 2014-08-29: 2 [IU] via SUBCUTANEOUS

## 2014-08-23 MED ORDER — METOCLOPRAMIDE HCL 10 MG PO TABS
10.0000 mg | ORAL_TABLET | Freq: Three times a day (TID) | ORAL | Status: DC
  Administered 2014-08-23 – 2014-08-29 (×18): 10 mg via ORAL
  Filled 2014-08-23 (×17): qty 1

## 2014-08-23 MED ORDER — SODIUM CHLORIDE 0.9 % IV SOLN
INTRAVENOUS | Status: AC
  Administered 2014-08-23 – 2014-08-24 (×3): via INTRAVENOUS

## 2014-08-23 MED ORDER — METOCLOPRAMIDE HCL 5 MG/ML IJ SOLN: 10.0000 mg | Freq: Four times a day (QID) | INTRAMUSCULAR | Status: DC | PRN

## 2014-08-23 MED ORDER — CARVEDILOL 3.125 MG PO TABS
6.2500 mg | ORAL_TABLET | Freq: Two times a day (BID) | ORAL | Status: DC
  Administered 2014-08-23 – 2014-08-29 (×13): 6.25 mg via ORAL
  Filled 2014-08-23 (×14): qty 2

## 2014-08-23 MED ORDER — DOCUSATE SODIUM 100 MG PO CAPS
100.0000 mg | ORAL_CAPSULE | Freq: Two times a day (BID) | ORAL | Status: DC
  Administered 2014-08-23 – 2014-08-27 (×7): 100 mg via ORAL
  Filled 2014-08-23 (×11): qty 1

## 2014-08-23 MED ORDER — PANTOPRAZOLE SODIUM 20 MG PO TBEC
20.0000 mg | DELAYED_RELEASE_TABLET | Freq: Every day | ORAL | Status: DC
  Filled 2014-08-23: qty 1

## 2014-08-23 MED ORDER — SORBITOL 70 % SOLN
960.0000 mL | TOPICAL_OIL | Freq: Once | ORAL | Status: AC
  Administered 2014-08-23: 960 mL via RECTAL
  Filled 2014-08-23: qty 240

## 2014-08-23 MED ORDER — PANTOPRAZOLE SODIUM 40 MG PO TBEC
40.0000 mg | DELAYED_RELEASE_TABLET | Freq: Every day | ORAL | Status: DC
  Administered 2014-08-23 – 2014-08-29 (×7): 40 mg via ORAL
  Filled 2014-08-23 (×7): qty 1

## 2014-08-23 MED ORDER — METOCLOPRAMIDE HCL 5 MG/ML IJ SOLN
10.0000 mg | Freq: Once | INTRAMUSCULAR | Status: AC
  Administered 2014-08-23: 10 mg via INTRAVENOUS
  Filled 2014-08-23: qty 2

## 2014-08-23 MED ORDER — ACETAMINOPHEN 650 MG RE SUPP: 650.0000 mg | Freq: Four times a day (QID) | RECTAL | Status: DC | PRN

## 2014-08-23 NOTE — Progress Notes (Signed)
Patient had positive results from [facility] enema,moderate soft brown stool. Patient did c/o abdominal discomfort. Will continue to monitor patient.

## 2014-08-23 NOTE — ED Provider Notes (Signed)
CSN: [id number]     Arrival date & time 08/22/14  2056 History   First MD Initiated Contact with Patient 08/22/14 2228     Chief Complaint  Patient presents with  . Nausea  . Emesis     (Consider location/radiation/quality/duration/timing/severity/associated sxs/prior Treatment) The history is provided by the patient and a parent.   Diana [name] is a 28 y.o. female with a history of DM,  recent hospitalization (discharged 3 days ago) for CAP, and also diagnosed with bilateral pleural effusion, Pericardial effusion, Acute systolic heart failure and acute renal failure, presenting with persistent nausea, vomiting and abdominal pain since her discharge.  She states she actually had persistent vomiting during the last 4 days of her hospitalization and was diagnosed with constipation per xrays obtained. She has taken correctal and had a small bm yesterday which has not improved her symptoms.  Her nausea becomes worse when she lies down.  She denies headache, dizziness, ear pain.  Her symptoms are constant with intermittent hot and cold chills along with shaking which parents at the bedside have observed intermittently.  She denies shortness of breath, chest pain, cough and peripheral edema, stating these symptoms are better since the hospital stay.      Past Medical History  Diagnosis Date  . Diabetes mellitus without complication   . HCAP (healthcare-associated pneumonia)   . Pleural effusion     bilateral  . Acute systolic heart failure   . Acute kidney injury   . Sinus tachycardia   . Obesity (BMI 30-39.9)     bmi 37.6   Past Surgical History  Procedure Laterality Date  . Appendectomy    . Esophagogastroduodenoscopy N/A 08/16/2014    Procedure: ESOPHAGOGASTRODUODENOSCOPY (EGD);  Surgeon: Daneil Dolin, MD;  Location: [facility];  Service: Endoscopy;  Laterality: N/A;   History reviewed. No pertinent family history. History  Substance Use Topics  . Smoking status: Never  Smoker   . Smokeless tobacco: Not on file  . Alcohol Use: No   OB History   Grav Para Term Preterm Abortions TAB SAB Ect Mult Living                 Review of Systems  Constitutional: Positive for chills. Negative for fever.  HENT: Negative for congestion and sore throat.   Eyes: Negative.   Respiratory: Negative for chest tightness and shortness of breath.   Cardiovascular: Negative for chest pain.  Gastrointestinal: Positive for nausea, vomiting, abdominal pain and constipation.  Genitourinary: Negative.   Musculoskeletal: Negative for arthralgias, joint swelling and neck pain.  Skin: Negative.  Negative for rash and wound.  Neurological: Negative for dizziness, weakness, light-headedness, numbness and headaches.  Psychiatric/Behavioral: Negative.       Allergies  Review of patient's allergies indicates no known allergies.  Home Medications   Prior to Admission medications   Medication Sig Start Date End Date Taking? Authorizing Provider  acetaminophen (TYLENOL) 500 MG tablet Take 1,000 mg by mouth daily as needed for moderate pain.    Historical Provider, MD  albuterol (PROAIR HFA) 108 (90 BASE) MCG/ACT inhaler Inhale 2 puffs into the lungs daily as needed for wheezing or shortness of breath.  08/06/14 08/13/14  Historical Provider, MD  carvedilol (COREG) 6.25 MG tablet Take 1 tablet (6.25 mg total) by mouth 2 (two) times daily with a meal. 08/19/14   Radene Gunning, NP  docusate sodium (COLACE) 100 MG capsule Take 1 capsule (100 mg total) by mouth 2 (two) times  daily. 08/19/14   Radene Gunning, NP  metFORMIN (GLUCOPHAGE) 500 MG tablet Take 500 mg by mouth daily.    Historical Provider, MD  metoCLOPramide (REGLAN) 10 MG tablet Take 1 tablet (10 mg total) by mouth 3 (three) times daily before meals. 08/19/14   Radene Gunning, NP  naproxen sodium (ANAPROX) 220 MG tablet Take 220 mg by mouth 2 (two) times daily as needed (pain).    Historical Provider, MD  pantoprazole (PROTONIX) 20 MG  tablet Take 1 tablet (20 mg total) by mouth daily. 08/19/14   Radene Gunning, NP   BP 138/83  Pulse 111  Temp(Src) 98 F (36.7 C) (Oral)  Resp 24  Ht 5\' 2"  (1.575 m)  Wt 184 lb (83.462 kg)  BMI 33.65 kg/m2  SpO2 100%  LMP 08/03/2014 Physical Exam  Nursing note and vitals reviewed. Constitutional: She is oriented to person, place, and time. She appears well-developed and well-nourished.  HENT:  Head: Normocephalic and atraumatic.  Eyes: Conjunctivae are normal.  Neck: Normal range of motion.  Cardiovascular: Normal rate, regular rhythm, normal heart sounds and intact distal pulses.   Pulmonary/Chest: Effort normal and breath sounds normal. She has no wheezes.  Abdominal: Soft. Bowel sounds are normal. She exhibits distension. She exhibits no mass. There is tenderness. There is no guarding.  Musculoskeletal: Normal range of motion.  Neurological: She is alert and oriented to person, place, and time.  Skin: Skin is warm. She is diaphoretic.  Psychiatric: She has a normal mood and affect.    ED Course  Procedures (including critical care time) Labs Review Labs Reviewed  COMPREHENSIVE METABOLIC PANEL - Abnormal; Notable for the following:    Potassium 3.1 (*)    Glucose, Bld 158 (*)    Creatinine, Ser 1.61 (*)    Albumin 3.2 (*)    GFR calc non Af Amer 43 (*)    GFR calc Af Amer 50 (*)    All other components within normal limits  CBC WITH DIFFERENTIAL - Abnormal; Notable for the following:    RBC 3.28 (*)    Hemoglobin 9.6 (*)    HCT 27.5 (*)    All other components within normal limits  URINALYSIS, ROUTINE W REFLEX MICROSCOPIC - Abnormal; Notable for the following:    Glucose, UA 100 (*)    Hgb urine dipstick TRACE (*)    Protein, ur 100 (*)    All other components within normal limits  CBG MONITORING, ED - Abnormal; Notable for the following:    Glucose-Capillary 148 (*)    All other components within normal limits  PREGNANCY, URINE  URINE MICROSCOPIC-ADD ON     Imaging Review Dg Abd Acute W/chest  08/23/2014   CLINICAL DATA:  Initial encounter for shortness breath, nausea, and vomiting for 2 weeks. History of diabetes. Personal history pleural effusion.  EXAM: ACUTE ABDOMEN SERIES (ABDOMEN 2 VIEW & CHEST 1 VIEW)  COMPARISON:  Two-view chest x-ray 08/08/2014.  FINDINGS: Heart is enlarged. Bilateral pleural effusions are near completely resolved.  Supine and upright views the abdomen demonstrate a nonspecific bowel gas pattern. There is no evidence for obstruction or free air. The axial skeleton is within normal limits.  IMPRESSION: 1. Mild cardiomegaly with interval clearing of bilateral pleural effusions. Minimal fluid remains on the left. 2. Negative abdomen.   Electronically Signed   By: Lawrence Santiago M.D.   On: 08/23/2014 01:43     EKG Interpretation None        MDM  Final diagnoses:  Intractable vomiting with nausea, vomiting of unspecified type  Hypokalemia  Constipation, unspecified constipation type    Patients labs and/or radiological studies were viewed and considered during the medical decision making and disposition process. Pt was given zofran, no further vomiting until attempt at PO challenge and oral potassium supplement.  Unable to tolerate PO fluids.  Acute abd series with large burden of stool upper descending colon.  Hypokalemia.    Call placed to Dr Maudie Mercury with [facility].  Temp admission orders completed.    Evalee Jefferson, PA-C 08/23/14 0201

## 2014-08-23 NOTE — Progress Notes (Signed)
[facility] PROGRESS NOTE  Diana [name] [id number] DOB: [birth year] DOA: 08/22/2014 PCP: Pcp Not In System  Assessment/Plan: 1. Constipation 1. Large amounts of stool seen on xray per my own read 2. Will order SMOG enema with linzess 3. Cont cathartics until results 2. DM2 1. Stable 2. Cont SSI  3. Hypokalemia 1. Cont to replace as needed 4. DVT prophylaxis 1. Lovenox  Code Status: Full Family Communication: Pt in room (indicate person spoken with, relationship, and if by phone, the number) Disposition Plan: Pending   Consultants:  none  Procedures:  none  Antibiotics:  none (indicate start date, and stop date if known)  HPI/Subjective: Still with abd pain and nausea.  Objective: Filed Vitals:   08/23/14 0200 08/23/14 0300 08/23/14 0524 08/23/14 0525  BP: 127/84  132/70   Pulse: 111  100   Temp: 98.2 F (36.8 C)  98.4 F (36.9 C)   TempSrc: Oral  Oral   Resp: 20  18   Height:  5\' 2"  (1.575 m)    Weight:    84.1 kg (185 lb 6.5 oz)  SpO2: 100%  100%    No intake or output data in the 24 hours ending 08/23/14 1234 Filed Weights   08/22/14 2100 08/23/14 0525  Weight: 83.462 kg (184 lb) 84.1 kg (185 lb 6.5 oz)    Exam:   General:  Awake, in nad  Cardiovascular: regular, s1, s2  Respiratory: normal resp effort, no wheezing  Abdomen: decreased BS, distended  Musculoskeletal: perfused, no clubbing   Data Reviewed: Basic Metabolic Panel:  Recent Labs Lab 08/17/14 0648 08/17/14 0653 08/18/14 0551 08/18/14 2205 08/19/14 0637 08/22/14  NA  --  138 138 136* 141 139  K  --  3.1* 3.1* 3.5* 3.2* 3.1*  CL  --  99 98 97 102 97  CO2  --  29 30 27 29 30   GLUCOSE  --  150* 108* 118* 130* 158*  BUN  --  14 12 13 12 11   CREATININE  --  2.07* 2.03* 1.88* 1.91* 1.61*  CALCIUM  --  8.0* 8.3* 8.6 8.3* 8.8  MG 1.5  --   --  1.6  --   --    Liver Function Tests:  Recent Labs Lab 08/18/14 2205 08/22/14  AST 17 10  ALT 17 8  ALKPHOS 61  68  BILITOT 0.6 0.5  PROT 6.8 7.0  ALBUMIN 3.0* 3.2*   No results found for this basename: LIPASE, AMYLASE,  in the last 168 hours No results found for this basename: AMMONIA,  in the last 168 hours CBC:  Recent Labs Lab 08/18/14 0551 08/19/14 0637 08/22/14  WBC 11.3* 10.8* 9.4  NEUTROABS  --   --  6.8  HGB 8.6* 8.3* 9.6*  HCT 24.8* 24.3* 27.5*  MCV 84.1 84.4 83.8  PLT 263 218 245   Cardiac Enzymes: No results found for this basename: CKTOTAL, CKMB, CKMBINDEX, TROPONINI,  in the last 168 hours BNP (last 3 results)  Recent Labs  08/08/14 0456  PROBNP 2629.0*   CBG:  Recent Labs Lab 08/18/14 1120 08/18/14 2103 08/19/14 0733 08/19/14 1142 08/23/14 0015  GLUCAP 112* 120* 140* 111* 148*    No results found for this or any previous visit (from the past 240 hour(s)).   Studies: Dg Abd Acute W/chest  08/23/2014   CLINICAL DATA:  Initial encounter for shortness breath, nausea, and vomiting for 2 weeks. History of diabetes. Personal history pleural effusion.  EXAM: ACUTE ABDOMEN SERIES (  ABDOMEN 2 VIEW & CHEST 1 VIEW)  COMPARISON:  Two-view chest x-ray 08/08/2014.  FINDINGS: Heart is enlarged. Bilateral pleural effusions are near completely resolved.  Supine and upright views the abdomen demonstrate a nonspecific bowel gas pattern. There is no evidence for obstruction or free air. The axial skeleton is within normal limits.  IMPRESSION: 1. Mild cardiomegaly with interval clearing of bilateral pleural effusions. Minimal fluid remains on the left. 2. Negative abdomen.   Electronically Signed   By: Lawrence Santiago M.D.   On: 08/23/2014 01:43    Scheduled Meds: . carvedilol  6.25 mg Oral BID WC  . docusate sodium  100 mg Oral BID  . enoxaparin (LOVENOX) injection  40 mg Subcutaneous Q24H  . insulin aspart  0-5 Units Subcutaneous QHS  . insulin aspart  0-9 Units Subcutaneous TID WC  . Linaclotide  145 mcg Oral Daily  . metoCLOPramide  10 mg Oral TID AC  . pantoprazole  40 mg  Oral Daily  . sorbitol, milk of mag, mineral oil, glycerin (SMOG) enema  960 mL Rectal Once   Continuous Infusions: . sodium chloride 75 mL/hr at 08/23/14 0301    Active Problems:   Nausea & vomiting  Time spent: 51min  Diana [name], [facility] Pager [phone]. If 7PM-7AM, please contact night-coverage at [email or web address], password [facility] 08/23/2014, 12:34 PM  LOS: 1 day

## 2014-08-23 NOTE — H&P (Signed)
Diana [name] is an 28 y.o. female.   Chief Complaint: n/v HPI: 28 yo female with dm2, c/o n/v,  No bloody emesis.  Pt denies fever, chills, cough, cp, palp, sob, diarrhea, brbpr, black stool.  Pt can't recall having gastric emptying study in the past.   Past Medical History  Diagnosis Date  . Diabetes mellitus without complication   . HCAP (healthcare-associated pneumonia)   . Pleural effusion     bilateral  . Acute systolic heart failure   . Acute kidney injury   . Sinus tachycardia   . Obesity (BMI 30-39.9)     bmi 37.6    Past Surgical History  Procedure Laterality Date  . Appendectomy    . Esophagogastroduodenoscopy N/A 08/16/2014    Procedure: ESOPHAGOGASTRODUODENOSCOPY (EGD);  Surgeon: Daneil Dolin, MD;  Location: [facility];  Service: Endoscopy;  Laterality: N/A;    History reviewed. No pertinent family history. Social History:  reports that she has never smoked. She does not have any smokeless tobacco history on file. She reports that she does not drink alcohol or use illicit drugs.  Allergies: No Known Allergies   (Not in a hospital admission)  Results for orders placed during the hospital encounter of 08/22/14 (from the past 48 hour(s))  COMPREHENSIVE METABOLIC PANEL     Status: Abnormal   Collection Time    08/22/14 12:00 AM      Result Value Ref Range   Sodium 139  137 - 147 mEq/L   Potassium 3.1 (*) 3.7 - 5.3 mEq/L   Chloride 97  96 - 112 mEq/L   CO2 30  19 - 32 mEq/L   Glucose, Bld 158 (*) 70 - 99 mg/dL   BUN 11  6 - 23 mg/dL   Creatinine, Ser 1.61 (*) 0.50 - 1.10 mg/dL   Calcium 8.8  8.4 - 10.5 mg/dL   Total Protein 7.0  6.0 - 8.3 g/dL   Albumin 3.2 (*) 3.5 - 5.2 g/dL   AST 10  0 - 37 U/L   ALT 8  0 - 35 U/L   Alkaline Phosphatase 68  39 - 117 U/L   Total Bilirubin 0.5  0.3 - 1.2 mg/dL   GFR calc non Af Amer 43 (*) >90 mL/min   GFR calc Af Amer 50 (*) >90 mL/min   Comment: (NOTE)     The eGFR has been calculated using the CKD EPI  equation.     This calculation has not been validated in all clinical situations.     eGFR's persistently <90 mL/min signify possible Chronic Kidney     Disease.   Anion gap 12  5 - 15  CBC WITH DIFFERENTIAL     Status: Abnormal   Collection Time    08/22/14 12:00 AM      Result Value Ref Range   WBC 9.4  4.0 - 10.5 K/uL   RBC 3.28 (*) 3.87 - 5.11 MIL/uL   Hemoglobin 9.6 (*) 12.0 - 15.0 g/dL   HCT 27.5 (*) 36.0 - 46.0 %   MCV 83.8  78.0 - 100.0 fL   MCH 29.3  26.0 - 34.0 pg   MCHC 34.9  30.0 - 36.0 g/dL   RDW 12.0  11.5 - 15.5 %   Platelets 245  150 - 400 K/uL   Neutrophils Relative % 73  43 - 77 %   Neutro Abs 6.8  1.7 - 7.7 K/uL   Lymphocytes Relative 17  12 - 46 %  Lymphs Abs 1.6  0.7 - 4.0 K/uL   Monocytes Relative 8  3 - 12 %   Monocytes Absolute 0.7  0.1 - 1.0 K/uL   Eosinophils Relative 2  0 - 5 %   Eosinophils Absolute 0.2  0.0 - 0.7 K/uL   Basophils Relative 0  0 - 1 %   Basophils Absolute 0.0  0.0 - 0.1 K/uL  URINALYSIS, ROUTINE W REFLEX MICROSCOPIC     Status: Abnormal   Collection Time    08/22/14 11:19 PM      Result Value Ref Range   Color, Urine YELLOW  YELLOW   APPearance CLEAR  CLEAR   Specific Gravity, Urine 1.015  1.005 - 1.030   pH 7.0  5.0 - 8.0   Glucose, UA 100 (*) NEGATIVE mg/dL   Hgb urine dipstick TRACE (*) NEGATIVE   Bilirubin Urine NEGATIVE  NEGATIVE   Ketones, ur NEGATIVE  NEGATIVE mg/dL   Protein, ur 100 (*) NEGATIVE mg/dL   Urobilinogen, UA 0.2  0.0 - 1.0 mg/dL   Nitrite NEGATIVE  NEGATIVE   Leukocytes, UA NEGATIVE  NEGATIVE  PREGNANCY, URINE     Status: None   Collection Time    08/22/14 11:19 PM      Result Value Ref Range   Preg Test, Ur NEGATIVE  NEGATIVE  URINE MICROSCOPIC-ADD ON     Status: None   Collection Time    08/22/14 11:19 PM      Result Value Ref Range   Squamous Epithelial / LPF RARE  RARE   WBC, UA 0-2  <3 WBC/hpf   RBC / HPF 0-2  <3 RBC/hpf   Bacteria, UA RARE  RARE  CBG MONITORING, ED     Status: Abnormal    Collection Time    08/23/14 12:15 AM      Result Value Ref Range   Glucose-Capillary 148 (*) 70 - 99 mg/dL   Dg Abd Acute W/chest  08/23/2014   CLINICAL DATA:  Initial encounter for shortness breath, nausea, and vomiting for 2 weeks. History of diabetes. Personal history pleural effusion.  EXAM: ACUTE ABDOMEN SERIES (ABDOMEN 2 VIEW & CHEST 1 VIEW)  COMPARISON:  Two-view chest x-ray 08/08/2014.  FINDINGS: Heart is enlarged. Bilateral pleural effusions are near completely resolved.  Supine and upright views the abdomen demonstrate a nonspecific bowel gas pattern. There is no evidence for obstruction or free air. The axial skeleton is within normal limits.  IMPRESSION: 1. Mild cardiomegaly with interval clearing of bilateral pleural effusions. Minimal fluid remains on the left. 2. Negative abdomen.   Electronically Signed   By: Lawrence Santiago M.D.   On: 08/23/2014 01:43    Review of Systems  Constitutional: Negative.   HENT: Negative.   Eyes: Negative.   Respiratory: Negative.   Cardiovascular: Negative.   Gastrointestinal: Positive for nausea and vomiting.  Genitourinary: Negative.   Musculoskeletal: Negative.   Skin: Negative.   Neurological: Negative.   Endo/Heme/Allergies: Negative.   Psychiatric/Behavioral: Negative.     Blood pressure 138/83, pulse 111, temperature 98 F (36.7 C), temperature source Oral, resp. rate 24, height 5' 2"  (1.575 m), weight 83.462 kg (184 lb), last menstrual period 08/03/2014, SpO2 100.00%. Physical Exam  Constitutional: She is oriented to person, place, and time. She appears well-developed and well-nourished.  HENT:  Head: Normocephalic and atraumatic.  Eyes: Conjunctivae and EOM are normal. Pupils are equal, round, and reactive to light.  Neck: Normal range of motion. Neck supple.  Cardiovascular: Normal rate  and regular rhythm.   Respiratory: Effort normal and breath sounds normal.  GI: Soft. Bowel sounds are normal. There is no tenderness.   Musculoskeletal: Normal range of motion.  Neurological: She is alert and oriented to person, place, and time. She has normal reflexes.  Skin: Skin is warm and dry.  Psychiatric: She has a normal mood and affect. Her behavior is normal. Judgment and thought content normal.     Assessment/Plan N/v:  D/c metformin,  D/c naproxen,  Consider gastric emptying study.  If not improving with stopping medication.   Renal insufficiency:  Check cmp Dm2: can't use metformin due to renal insufficiency and hx of chf.  Hypokalemia: replete, check cmp in am Constipation: colace prn  Jani Gravel 08/23/2014, 1:52 AM

## 2014-08-23 NOTE — Care Management Utilization Note (Signed)
UR completed 

## 2014-08-23 NOTE — Progress Notes (Signed)
SMOG enema given per Dr Delmer Islam results. Will continue to monitor patient.

## 2014-08-23 NOTE — ED Provider Notes (Signed)
Medical screening examination/treatment/procedure(s) were performed by non-physician practitioner and as supervising physician I was immediately available for consultation/collaboration.   EKG Interpretation None        Wynetta Fines, MD 08/23/14 [phone]

## 2014-08-24 ENCOUNTER — Inpatient Hospital Stay (HOSPITAL_COMMUNITY): Payer: BC Managed Care – PPO

## 2014-08-24 LAB — GLUCOSE, CAPILLARY
Glucose-Capillary: 129 mg/dL — ABNORMAL HIGH (ref 70–99)
Glucose-Capillary: 139 mg/dL — ABNORMAL HIGH (ref 70–99)
Glucose-Capillary: 95 mg/dL (ref 70–99)
Glucose-Capillary: 125 mg/dL — ABNORMAL HIGH (ref 70–99)

## 2014-08-24 LAB — BASIC METABOLIC PANEL
Anion gap: 12 (ref 5–15)
BUN: 8 mg/dL (ref 6–23)
CO2: 28 mEq/L (ref 19–32)
Calcium: 8 mg/dL — ABNORMAL LOW (ref 8.4–10.5)
Chloride: 100 mEq/L (ref 96–112)
Creatinine, Ser: 1.44 mg/dL — ABNORMAL HIGH (ref 0.50–1.10)
GFR calc Af Amer: 57 mL/min — ABNORMAL LOW (ref >90)
GFR calc non Af Amer: 50 mL/min — ABNORMAL LOW (ref >90)
Glucose, Bld: 142 mg/dL — ABNORMAL HIGH (ref 70–99)
Potassium: 2.8 mEq/L — CL (ref 3.7–5.3)
Sodium: 140 mEq/L (ref 137–147)

## 2014-08-24 MED ORDER — METOCLOPRAMIDE HCL 5 MG/ML IJ SOLN
10.0000 mg | Freq: Four times a day (QID) | INTRAMUSCULAR | Status: DC | PRN
  Administered 2014-08-28: 10 mg via INTRAVENOUS
  Filled 2014-08-24 (×2): qty 2

## 2014-08-24 MED ORDER — PEG 3350-KCL-NA BICARB-NACL 420 G PO SOLR
4000.0000 mL | Freq: Once | ORAL | Status: AC
  Administered 2014-08-24: 4000 mL via ORAL
  Filled 2014-08-24: qty 4000

## 2014-08-24 MED ORDER — SORBITOL 70 % SOLN
960.0000 mL | TOPICAL_OIL | Freq: Once | ORAL | Status: AC
  Administered 2014-08-24: 960 mL via RECTAL
  Filled 2014-08-24: qty 240

## 2014-08-24 MED ORDER — POTASSIUM CHLORIDE CRYS ER 20 MEQ PO TBCR
40.0000 meq | EXTENDED_RELEASE_TABLET | Freq: Once | ORAL | Status: AC
  Administered 2014-08-24: 40 meq via ORAL
  Filled 2014-08-24 (×2): qty 2

## 2014-08-24 NOTE — Progress Notes (Signed)
[facility] PROGRESS NOTE  Diana [name] [id number] DOB: [birth year] DOA: 08/22/2014 PCP: Pcp Not In System  Assessment/Plan: 1. Constipation 1. Large stool seen per my own read of imaging 2. Pt given cathartics with multiple bowel movements overnight 3. Given hx of DM, consider gastric emptying study 4. Pt still nauseated after multiple BM. Will repeat abd xray for interval change 2. DM2 1. Stable 2. Cont SSI  3. Hypokalemia 1. Cont to replace as needed 4. DVT prophylaxis 1. Lovenox  Code Status: Full Family Communication: Pt in room, mother and father at bedside Disposition Plan: Pending   Consultants:  none  Procedures:  none  Antibiotics:  none (indicate start date, and stop date if known)  HPI/Subjective: Nauseated this AM. Multiple BM noted overnight.  Objective: Filed Vitals:   08/23/14 0524 08/23/14 0525 08/23/14 1600 08/24/14 0619  BP: 132/70  119/85 131/81  Pulse: 100  102 106  Temp: 98.4 F (36.9 C)  97.9 F (36.6 C) 98.2 F (36.8 C)  TempSrc: Oral  Oral Oral  Resp: 18  18 18   Height:      Weight:  84.1 kg (185 lb 6.5 oz)    SpO2: 100%  100% 100%    Intake/Output Summary (Last 24 hours) at 08/24/14 1047 Last data filed at 08/24/14 0600  Gross per 24 hour  Intake 2023.75 ml  Output      0 ml  Net 2023.75 ml   Filed Weights   08/22/14 2100 08/23/14 0525  Weight: 83.462 kg (184 lb) 84.1 kg (185 lb 6.5 oz)    Exam:   General:  Awake, in nad  Cardiovascular: regular, s1, s2  Respiratory: normal resp effort, no wheezing  Abdomen: decreased BS, distended  Musculoskeletal: perfused, no clubbing   Data Reviewed: Basic Metabolic Panel:  Recent Labs Lab 08/18/14 0551 08/18/14 2205 08/19/14 0637 08/22/14 08/22/14 2345  NA 138 136* 141 139  --   K 3.1* 3.5* 3.2* 3.1*  --   CL 98 97 102 97  --   CO2 30 27 29 30   --   GLUCOSE 108* 118* 130* 158*  --   BUN 12 13 12 11   --   CREATININE 2.03* 1.88* [id number]* 0000000*  DUPLICATE REQUEST  CALCIUM 8.3* 8.6 8.3* 8.8  --   MG  --  1.6  --   --   --    Liver Function Tests:  Recent Labs Lab 08/18/14 2205 08/22/14  AST 17 10  ALT 17 8  ALKPHOS 61 68  BILITOT 0.6 0.5  PROT 6.8 7.0  ALBUMIN 3.0* 3.2*   No results found for this basename: LIPASE, AMYLASE,  in the last 168 hours No results found for this basename: AMMONIA,  in the last 168 hours CBC:  Recent Labs Lab 08/18/14 0551 08/19/14 0637 08/22/14  WBC 11.3* 10.8* 9.4  NEUTROABS  --   --  6.8  HGB 8.6* 8.3* 9.6*  HCT 24.8* 24.3* 27.5*  MCV 84.1 84.4 83.8  PLT 263 218 245   Cardiac Enzymes: No results found for this basename: CKTOTAL, CKMB, CKMBINDEX, TROPONINI,  in the last 168 hours BNP (last 3 results)  Recent Labs  08/08/14 0456  PROBNP 2629.0*   CBG:  Recent Labs Lab 08/23/14 0015 08/23/14 1236 08/23/14 1629 08/23/14 2039 08/24/14 0809  GLUCAP 148* 137* 140* 215* 129*    No results found for this or any previous visit (from the past 240 hour(s)).   Studies: Dg Abd Acute W/chest  08/23/2014  CLINICAL DATA:  Initial encounter for shortness breath, nausea, and vomiting for 2 weeks. History of diabetes. Personal history pleural effusion.  EXAM: ACUTE ABDOMEN SERIES (ABDOMEN 2 VIEW & CHEST 1 VIEW)  COMPARISON:  Two-view chest x-ray 08/08/2014.  FINDINGS: Heart is enlarged. Bilateral pleural effusions are near completely resolved.  Supine and upright views the abdomen demonstrate a nonspecific bowel gas pattern. There is no evidence for obstruction or free air. The axial skeleton is within normal limits.  IMPRESSION: 1. Mild cardiomegaly with interval clearing of bilateral pleural effusions. Minimal fluid remains on the left. 2. Negative abdomen.   Electronically Signed   By: Lawrence Santiago M.D.   On: 08/23/2014 01:43    Scheduled Meds: . carvedilol  6.25 mg Oral BID WC  . docusate sodium  100 mg Oral BID  . enoxaparin (LOVENOX) injection  40 mg Subcutaneous Q24H  . insulin  aspart  0-5 Units Subcutaneous QHS  . insulin aspart  0-9 Units Subcutaneous TID WC  . Linaclotide  145 mcg Oral Daily  . metoCLOPramide  10 mg Oral TID AC  . pantoprazole  40 mg Oral Daily  . polyethylene glycol  17 g Oral Daily   Continuous Infusions: . sodium chloride 75 mL/hr at 08/24/14 [id number]    Active Problems:   Nausea & vomiting  Time spent: 56min  CHIU, [place] [facility] Pager [phone]. If 7PM-7AM, please contact night-coverage at [email or web address], password [facility] 08/24/2014, 10:47 AM  LOS: 2 days

## 2014-08-24 NOTE — Progress Notes (Signed)
Patient refusing to drink [place] notified.

## 2014-08-24 NOTE — Care Management Note (Unsigned)
    Page 1 of 1   08/24/2014     4:21:31 PM CARE MANAGEMENT NOTE 08/24/2014  Patient:  Diana [name], Diana [name]   Account Number:  [id number]  Date Initiated:  08/24/2014  Documentation initiated by:  Vladimir Creeks  Subjective/Objective Assessment:   Admitted with N&V. Pt is from home with parents. She is independent and will return home at D/C     Action/Plan:   Anticipated DC Date:  08/24/2014   Anticipated DC Plan:  HOME/SELF CARE         Choice offered to / List presented to:             Status of service:  In process, will continue to follow Medicare Important Message given?   (If response is "NO", the following Medicare IM given date fields will be blank) Date Medicare IM given:   Medicare IM given by:   Date Additional Medicare IM given:   Additional Medicare IM given by:    Discharge Disposition:    Per UR Regulation:  Reviewed for med. necessity/level of care/duration of stay  If discussed at [place] of Stay Meetings, dates discussed:    Comments:  08/24/14 [place] RN/CM

## 2014-08-24 NOTE — Progress Notes (Signed)
SMOG enema given per Dr Terri Skains continue to monitor patient.

## 2014-08-25 ENCOUNTER — Inpatient Hospital Stay (HOSPITAL_COMMUNITY): Payer: BC Managed Care – PPO

## 2014-08-25 DIAGNOSIS — K5900 Constipation, unspecified: Secondary | ICD-10-CM

## 2014-08-25 LAB — GLUCOSE, CAPILLARY
Glucose-Capillary: 120 mg/dL — ABNORMAL HIGH (ref 70–99)
Glucose-Capillary: 141 mg/dL — ABNORMAL HIGH (ref 70–99)
Glucose-Capillary: 128 mg/dL — ABNORMAL HIGH (ref 70–99)
Glucose-Capillary: 121 mg/dL — ABNORMAL HIGH (ref 70–99)

## 2014-08-25 LAB — MAGNESIUM: Magnesium: 1.4 mg/dL — ABNORMAL LOW (ref 1.5–2.5)

## 2014-08-25 MED ORDER — ONDANSETRON HCL 4 MG/2ML IJ SOLN
4.0000 mg | Freq: Four times a day (QID) | INTRAMUSCULAR | Status: DC
  Administered 2014-08-25 – 2014-08-29 (×8): 4 mg via INTRAVENOUS
  Filled 2014-08-25 (×9): qty 2

## 2014-08-25 MED ORDER — POTASSIUM CHLORIDE 10 MEQ/100ML IV SOLN
10.0000 meq | INTRAVENOUS | Status: AC
  Administered 2014-08-25 (×3): 10 meq via INTRAVENOUS
  Filled 2014-08-25: qty 100

## 2014-08-25 MED ORDER — PROMETHAZINE HCL 25 MG/ML IJ SOLN: 12.5000 mg | Freq: Four times a day (QID) | INTRAMUSCULAR | Status: DC | PRN

## 2014-08-25 NOTE — Plan of Care (Signed)
Dr. Informed about pt not being able to keep fluids down - Has vomitted X2 (ttl 350 cc since trying to drink CL diet). Pt diaphorectic Temp 98 (oral)

## 2014-08-25 NOTE — Progress Notes (Signed)
[facility] PROGRESS NOTE  Diana [name] [id number] DOB: Feb 26, 1986 DOA: 08/22/2014 PCP: Pcp Not In System  Assessment/Plan: Nausea/Vomiting -Presumed related to diabetic gastroparesis. -Unable to complete GES today due to severe nausea. -Continue reglan, zofran. -Can consider trial of erythromycin in am if no improvement.  DM II -Well controlled.  Constipation -Resolved. Has had a BM 2 days in a row.  Code Status: Full Code Family Communication: Patient only  Disposition Plan: Home when ready   Consultants:  None   Antibiotics:  None   Subjective: Still with significant N/v.  Objective: Filed Vitals:   08/24/14 1423 08/24/14 2114 08/25/14 0546 08/25/14 1011  BP: 136/76 129/93 136/94 142/82  Pulse: 112 110 110   Temp: 98.4 F (36.9 C)  98.1 F (36.7 C)   TempSrc:   Oral   Resp: 16 20 20    Height:      Weight:   84.7 kg (186 lb 11.7 oz)   SpO2: 100% 100% 100%     Intake/Output Summary (Last 24 hours) at 08/25/14 1250 Last data filed at 08/25/14 1232  Gross per 24 hour  Intake 973.75 ml  Output      0 ml  Net 973.75 ml   Filed Weights   08/22/14 2100 08/23/14 0525 08/25/14 0546  Weight: 83.462 kg (184 lb) 84.1 kg (185 lb 6.5 oz) 84.7 kg (186 lb 11.7 oz)    Exam:   General:  AA Ox3  Cardiovascular: RRR, no M/R/G  Respiratory: CTA B  Abdomen: S/NT/ND/+BS  Extremities: no C/C/E   Neurologic:  Intact/non-focal  Data Reviewed: Basic Metabolic Panel:  Recent Labs Lab 08/18/14 2205 08/19/14 0637 08/22/14 08/22/14 2345 08/24/14 1107  NA 136* 141 139  --  140  K 3.5* 3.2* 3.1*  --  2.8*  CL 97 102 97  --  100  CO2 27 29 30   --  28  GLUCOSE 118* 130* 158*  --  142*  BUN 13 12 11   --  8  CREATININE 1.88* [id number]* 0000000* DUPLICATE REQUEST [id number]*  CALCIUM 8.6 8.3* 8.8  --  8.0*  MG 1.6  --   --   --   --    Liver Function Tests:  Recent Labs Lab 08/18/14 2205 08/22/14  AST 17 10  ALT 17 8  ALKPHOS 61 68  BILITOT  0.6 0.5  PROT 6.8 7.0  ALBUMIN 3.0* 3.2*   No results found for this basename: LIPASE, AMYLASE,  in the last 168 hours No results found for this basename: AMMONIA,  in the last 168 hours CBC:  Recent Labs Lab 08/19/14 0637 08/22/14  WBC 10.8* 9.4  NEUTROABS  --  6.8  HGB 8.3* 9.6*  HCT 24.3* 27.5*  MCV 84.4 83.8  PLT 218 245   Cardiac Enzymes: No results found for this basename: CKTOTAL, CKMB, CKMBINDEX, TROPONINI,  in the last 168 hours BNP (last 3 results)  Recent Labs  08/08/14 0456  PROBNP 2629.0*   CBG:  Recent Labs Lab 08/24/14 1141 08/24/14 1632 08/24/14 2113 08/25/14 0736 08/25/14 1131  GLUCAP 139* 95 125* 120* 141*    No results found for this or any previous visit (from the past 240 hour(s)).   Studies: Dg Abd Portable 1v  08/24/2014   CLINICAL DATA:  One week history of nausea, vomiting and constipation. Current history of diabetes.  EXAM: PORTABLE ABDOMEN - 1 VIEW  COMPARISON:  Acute abdomen series yesterday. CT abdomen and pelvis 08/17/2014. Two-view abdomen x-ray 08/12/2014, 08/09/2014.  FINDINGS: Bowel gas pattern unremarkable without evidence of obstruction or significant ileus. Expected stool burden in the colon. No abnormal calcifications. Regional skeleton intact.  IMPRESSION: No acute abdominal abnormality.   Electronically Signed   By: Evangeline Dakin M.D.   On: 08/24/2014 14:49    Scheduled Meds: . carvedilol  6.25 mg Oral BID WC  . docusate sodium  100 mg Oral BID  . enoxaparin (LOVENOX) injection  40 mg Subcutaneous Q24H  . insulin aspart  0-5 Units Subcutaneous QHS  . insulin aspart  0-9 Units Subcutaneous TID WC  . Linaclotide  145 mcg Oral Daily  . metoCLOPramide  10 mg Oral TID AC  . pantoprazole  40 mg Oral Daily  . polyethylene glycol  17 g Oral Daily  . potassium chloride  10 mEq Intravenous Q1 Hr x 3   Continuous Infusions:   Principal Problem:   Nausea & vomiting Active Problems:   DM type 2, controlled, with  complication   Constipation    Time spent: 35 minutes.  Greater than 50% of this time was spent in direct contact with the patient coordinating care.    Lelon Frohlich  [facility] Pager [phone]  If 7PM-7AM, please contact night-coverage at [email or web address], password [facility] 08/25/2014, 12:50 PM  LOS: 3 days

## 2014-08-26 LAB — GLUCOSE, CAPILLARY
Glucose-Capillary: 117 mg/dL — ABNORMAL HIGH (ref 70–99)
Glucose-Capillary: 145 mg/dL — ABNORMAL HIGH (ref 70–99)
Glucose-Capillary: 126 mg/dL — ABNORMAL HIGH (ref 70–99)
Glucose-Capillary: 122 mg/dL — ABNORMAL HIGH (ref 70–99)

## 2014-08-26 LAB — BASIC METABOLIC PANEL
Anion gap: 14 (ref 5–15)
BUN: 7 mg/dL (ref 6–23)
CO2: 26 mEq/L (ref 19–32)
Calcium: 8.1 mg/dL — ABNORMAL LOW (ref 8.4–10.5)
Chloride: 101 mEq/L (ref 96–112)
Creatinine, Ser: 1.52 mg/dL — ABNORMAL HIGH (ref 0.50–1.10)
GFR calc Af Amer: 54 mL/min — ABNORMAL LOW (ref >90)
GFR calc non Af Amer: 46 mL/min — ABNORMAL LOW (ref >90)
Glucose, Bld: 120 mg/dL — ABNORMAL HIGH (ref 70–99)
Potassium: 3.2 mEq/L — ABNORMAL LOW (ref 3.7–5.3)
Sodium: 141 mEq/L (ref 137–147)

## 2014-08-26 MED ORDER — PROMETHAZINE HCL 25 MG/ML IJ SOLN: 12.5000 mg | Freq: Four times a day (QID) | INTRAMUSCULAR | Status: DC | PRN

## 2014-08-26 NOTE — Plan of Care (Signed)
Confirmed that Gastric emptying study needs to be done with NPO after midnight and no GI meds (reglan, phenergan or zofran) w/ 24 hrs.  Dr.. Family and pt informed.  Pt will be NPO at midnight.

## 2014-08-26 NOTE — Progress Notes (Signed)
Paged MD Maryland Pink about patient and family wanting to be transferred to [facility] for further testing. MD Maryland Pink said that he would discuss with MD Jerilee Hoh this am and will followup with patient and family. Called MD for pain meds as well will address with MD Jerilee Hoh as well this am.

## 2014-08-26 NOTE — Progress Notes (Signed)
[facility] PROGRESS NOTE  Diana [name] [id number] DOB: [birth year] DOA: 08/22/2014 PCP: Pcp Not In System  Assessment/Plan: Nausea/Vomiting -Presumed related to diabetic gastroparesis. -Unable to complete GES 10/7 secondary to profuse nausea. Will reattempt tomorrow. -Will need to hold Reglan, Phenergan, Zofran, pain medications overnight in order to perform a gastric emptying scan so we'll anticipate worsening of symptoms overnight. -Can consider trial of erythromycin in am if no improvement.  DM II -Well controlled.  Constipation -Resolved. Has had a BM 2 days in a row.  Code Status: Full Code Family Communication: Prolonged discussion today with patient's parents at bedside regarding her condition and prognosis. Disposition Plan: Home when ready   Consultants:  None   Antibiotics:  None   Subjective: Appears improved today with less nausea, only one episode of emesis overnight.  Objective: Filed Vitals:   08/26/14 0616 08/26/14 1044 08/26/14 1300 08/26/14 1655  BP: 104/70 110/72 123/80 122/84  Pulse: 106  108   Temp: 97.7 F (36.5 C)  98.9 F (37.2 C)   TempSrc: Oral  Oral   Resp: 15  16   Height:      Weight: 84.3 kg (185 lb 13.6 oz)     SpO2: 100%  100%     Intake/Output Summary (Last 24 hours) at 08/26/14 1840 Last data filed at 08/26/14 1700  Gross per 24 hour  Intake    120 ml  Output    450 ml  Net   -330 ml   Filed Weights   08/23/14 0525 08/25/14 0546 08/26/14 0616  Weight: 84.1 kg (185 lb 6.5 oz) 84.7 kg (186 lb 11.7 oz) 84.3 kg (185 lb 13.6 oz)    Exam:   General:  AA Ox3  Cardiovascular: RRR, no M/R/G  Respiratory: CTA B  Abdomen: S/NT/ND/+BS  Extremities: no C/C/E   Neurologic:  Intact/non-focal  Data Reviewed: Basic Metabolic Panel:  Recent Labs Lab 08/22/14 08/22/14 2345 08/24/14 1107 08/25/14 1415 08/26/14 0555  NA 139  --  140  --  141  K 3.1*  --  2.8*  --  3.2*  CL 97  --  100  --  101    CO2 30  --  28  --  26  GLUCOSE 158*  --  142*  --  120*  BUN 11  --  8  --  7  CREATININE 0000000* DUPLICATE REQUEST [id number]*  --  1.52*  CALCIUM 8.8  --  8.0*  --  8.1*  MG  --   --   --  1.4*  --    Liver Function Tests:  Recent Labs Lab 08/22/14  AST 10  ALT 8  ALKPHOS 68  BILITOT 0.5  PROT 7.0  ALBUMIN 3.2*   No results found for this basename: LIPASE, AMYLASE,  in the last 168 hours No results found for this basename: AMMONIA,  in the last 168 hours CBC:  Recent Labs Lab 08/22/14  WBC 9.4  NEUTROABS 6.8  HGB 9.6*  HCT 27.5*  MCV 83.8  PLT 245   Cardiac Enzymes: No results found for this basename: CKTOTAL, CKMB, CKMBINDEX, TROPONINI,  in the last 168 hours BNP (last 3 results)  Recent Labs  08/08/14 0456  PROBNP 2629.0*   CBG:  Recent Labs Lab 08/25/14 1626 08/25/14 2102 08/26/14 0747 08/26/14 1135 08/26/14 1635  GLUCAP 128* 121* 117* 145* 126*    No results found for this or any previous visit (from the past 240 hour(s)).   Studies:  No results found.  Scheduled Meds: . carvedilol  6.25 mg Oral BID WC  . docusate sodium  100 mg Oral BID  . enoxaparin (LOVENOX) injection  40 mg Subcutaneous Q24H  . insulin aspart  0-5 Units Subcutaneous QHS  . insulin aspart  0-9 Units Subcutaneous TID WC  . Linaclotide  145 mcg Oral Daily  . metoCLOPramide  10 mg Oral TID AC  . ondansetron (ZOFRAN) IV  4 mg Intravenous 4 times per day  . pantoprazole  40 mg Oral Daily  . polyethylene glycol  17 g Oral Daily   Continuous Infusions:   Principal Problem:   Nausea & vomiting Active Problems:   DM type 2, controlled, with complication   Constipation    Time spent: 35 minutes.  Greater than 50% of this time was spent in direct contact with the patient coordinating care.    Lelon Frohlich  [facility] Pager [phone]  If 7PM-7AM, please contact night-coverage at [email or web address], password [facility] 08/26/2014, 6:40 PM  LOS: 4 days

## 2014-08-26 NOTE — Plan of Care (Signed)
Pt and family under assumption that pt has a GI blockage and needs to be transferred to [facility] for further testing.  None of our testing indicates that pt has a blockage but family requesting to speak to dr this AM.  Dr. Informed and stated she would try to stop by pt room in about an hour (after going to ICU).  Family informed.

## 2014-08-27 ENCOUNTER — Inpatient Hospital Stay (HOSPITAL_COMMUNITY): Payer: BC Managed Care – PPO

## 2014-08-27 ENCOUNTER — Encounter: Payer: BC Managed Care – PPO | Admitting: Adult Health

## 2014-08-27 ENCOUNTER — Encounter: Payer: Self-pay | Admitting: *Deleted

## 2014-08-27 LAB — GLUCOSE, CAPILLARY
Glucose-Capillary: 120 mg/dL — ABNORMAL HIGH (ref 70–99)
Glucose-Capillary: 110 mg/dL — ABNORMAL HIGH (ref 70–99)
Glucose-Capillary: 134 mg/dL — ABNORMAL HIGH (ref 70–99)
Glucose-Capillary: 141 mg/dL — ABNORMAL HIGH (ref 70–99)

## 2014-08-27 LAB — BASIC METABOLIC PANEL
Anion gap: 14 (ref 5–15)
BUN: 7 mg/dL (ref 6–23)
CO2: 27 mEq/L (ref 19–32)
Calcium: 8.2 mg/dL — ABNORMAL LOW (ref 8.4–10.5)
Chloride: 99 mEq/L (ref 96–112)
Creatinine, Ser: 1.45 mg/dL — ABNORMAL HIGH (ref 0.50–1.10)
GFR calc Af Amer: 57 mL/min — ABNORMAL LOW (ref >90)
GFR calc non Af Amer: 49 mL/min — ABNORMAL LOW (ref >90)
Glucose, Bld: 122 mg/dL — ABNORMAL HIGH (ref 70–99)
Potassium: 3.2 mEq/L — ABNORMAL LOW (ref 3.7–5.3)
Sodium: 140 mEq/L (ref 137–147)

## 2014-08-27 LAB — MAGNESIUM: Magnesium: 1.3 mg/dL — ABNORMAL LOW (ref 1.5–2.5)

## 2014-08-27 MED ORDER — POTASSIUM CHLORIDE CRYS ER 20 MEQ PO TBCR
40.0000 meq | EXTENDED_RELEASE_TABLET | ORAL | Status: AC
  Administered 2014-08-27 (×2): 40 meq via ORAL
  Filled 2014-08-27 (×2): qty 2

## 2014-08-27 NOTE — Progress Notes (Signed)
No Show

## 2014-08-27 NOTE — Progress Notes (Signed)
[facility] PROGRESS NOTE  Diana [name] [id number] DOB: [birth year] DOA: 08/22/2014 PCP: Pcp Not In System  Assessment/Plan: Nausea/Vomiting -Presumed related to diabetic gastroparesis. -Unable to complete GES 10/7 and 10/9 secondary to nausea. -Will treat presumptively as gastroparesis as this is what I believe she has. Interestingly, her mother has gastroparesis as well. -Continue reglan and scheduled zofran.  -Can consider trial of erythromycin in am if no improvement.  DM II -Well controlled.  Constipation -Resolved. Has had a BM 2 days in a row.  Code Status: Full Code Family Communication: Patient only  Disposition Plan: Home when ready   Consultants:  None   Antibiotics:  None   Subjective: States she has only vomited twice in 24 hours, but continues to be nauseous.  Objective: Filed Vitals:   08/26/14 1655 08/26/14 2248 08/27/14 0527 08/27/14 1107  BP: 122/84 115/71 124/87   Pulse:  102 98   Temp:  97.4 F (36.3 C) 98.7 F (37.1 C)   TempSrc:  Oral Oral   Resp:  20 20   Height:      Weight:   83.054 kg (183 lb 1.6 oz) 84.097 kg (185 lb 6.4 oz)  SpO2:  100% 100%     Intake/Output Summary (Last 24 hours) at 08/27/14 1312 Last data filed at 08/26/14 1700  Gross per 24 hour  Intake      0 ml  Output      0 ml  Net      0 ml   Filed Weights   08/26/14 0616 08/27/14 0527 08/27/14 1107  Weight: 84.3 kg (185 lb 13.6 oz) 83.054 kg (183 lb 1.6 oz) 84.097 kg (185 lb 6.4 oz)    Exam:   General:  AA Ox3  Cardiovascular: RRR, no M/R/G  Respiratory: CTA B  Abdomen: S/NT/ND/+BS  Extremities: no C/C/E   Neurologic:  Intact/non-focal  Data Reviewed: Basic Metabolic Panel:  Recent Labs Lab 08/22/14 08/22/14 2345 08/24/14 1107 08/25/14 1415 08/26/14 0555 08/27/14 0607 08/27/14 0614  NA 139  --  140  --  141  --  140  K 3.1*  --  2.8*  --  3.2*  --  3.2*  CL 97  --  100  --  101  --  99  CO2 30  --  28  --  26  --  27    GLUCOSE 158*  --  142*  --  120*  --  122*  BUN 11  --  8  --  7  --  7  CREATININE 0000000* DUPLICATE REQUEST [id number]*  --  1.52*  --  1.45*  CALCIUM 8.8  --  8.0*  --  8.1*  --  8.2*  MG  --   --   --  1.4*  --  1.3*  --    Liver Function Tests:  Recent Labs Lab 08/22/14  AST 10  ALT 8  ALKPHOS 68  BILITOT 0.5  PROT 7.0  ALBUMIN 3.2*   No results found for this basename: LIPASE, AMYLASE,  in the last 168 hours No results found for this basename: AMMONIA,  in the last 168 hours CBC:  Recent Labs Lab 08/22/14  WBC 9.4  NEUTROABS 6.8  HGB 9.6*  HCT 27.5*  MCV 83.8  PLT 245   Cardiac Enzymes: No results found for this basename: CKTOTAL, CKMB, CKMBINDEX, TROPONINI,  in the last 168 hours BNP (last 3 results)  Recent Labs  08/08/14 0456  PROBNP 2629.0*  CBG:  Recent Labs Lab 08/26/14 1135 08/26/14 1635 08/26/14 2044 08/27/14 0748 08/27/14 1206  GLUCAP 145* 126* 122* 120* 110*    No results found for this or any previous visit (from the past 240 hour(s)).   Studies: No results found.  Scheduled Meds: . carvedilol  6.25 mg Oral BID WC  . docusate sodium  100 mg Oral BID  . enoxaparin (LOVENOX) injection  40 mg Subcutaneous Q24H  . insulin aspart  0-5 Units Subcutaneous QHS  . insulin aspart  0-9 Units Subcutaneous TID WC  . Linaclotide  145 mcg Oral Daily  . metoCLOPramide  10 mg Oral TID AC  . ondansetron (ZOFRAN) IV  4 mg Intravenous 4 times per day  . pantoprazole  40 mg Oral Daily  . polyethylene glycol  17 g Oral Daily  . potassium chloride  40 mEq Oral Q4H   Continuous Infusions:   Principal Problem:   Nausea & vomiting Active Problems:   DM type 2, controlled, with complication   Constipation    Time spent: 35 minutes.  Greater than 50% of this time was spent in direct contact with the patient coordinating care.    Lelon Frohlich  [facility] Pager [phone]  If 7PM-7AM, please contact night-coverage at [email or web address],  password [facility] 08/27/2014, 1:12 PM  LOS: 5 days

## 2014-08-27 NOTE — Progress Notes (Addendum)
Nuclear Medicine called to inform me that pt was unable to complete study. Pt was unable to keep any food down. MD aware. Per night RN, pt was able to tolerate ice chips and small sips of liquid before midnight with no nausea or vomiting. Pt back in room with no complaints at this time. Will continue to monitor.

## 2014-08-28 DIAGNOSIS — K5909 Other constipation: Secondary | ICD-10-CM

## 2014-08-28 DIAGNOSIS — N184 Chronic kidney disease, stage 4 (severe): Secondary | ICD-10-CM

## 2014-08-28 DIAGNOSIS — E1122 Type 2 diabetes mellitus with diabetic chronic kidney disease: Secondary | ICD-10-CM

## 2014-08-28 LAB — GLUCOSE, CAPILLARY
Glucose-Capillary: 116 mg/dL — ABNORMAL HIGH (ref 70–99)
Glucose-Capillary: 133 mg/dL — ABNORMAL HIGH (ref 70–99)
Glucose-Capillary: 136 mg/dL — ABNORMAL HIGH (ref 70–99)
Glucose-Capillary: 180 mg/dL — ABNORMAL HIGH (ref 70–99)

## 2014-08-28 MED ORDER — MAGNESIUM OXIDE 400 (241.3 MG) MG PO TABS
400.0000 mg | ORAL_TABLET | Freq: Three times a day (TID) | ORAL | Status: DC
  Administered 2014-08-28 – 2014-08-29 (×3): 400 mg via ORAL
  Filled 2014-08-28 (×3): qty 1

## 2014-08-28 MED ORDER — POTASSIUM CHLORIDE CRYS ER 20 MEQ PO TBCR
40.0000 meq | EXTENDED_RELEASE_TABLET | Freq: Once | ORAL | Status: AC
  Administered 2014-08-28: 40 meq via ORAL
  Filled 2014-08-28: qty 2

## 2014-08-28 NOTE — Progress Notes (Addendum)
[facility] PROGRESS NOTE  Diana [name] [id number] DOB: 06-23-1986 DOA: 08/22/2014 PCP: Pcp Not In System  Assessment/Plan: Nausea/Vomiting -Presumed related to diabetic gastroparesis. -Unable to complete GES 10/7 and 10/9 secondary to nausea. -Will treat presumptively as gastroparesis as this is what I believe she has. Interestingly, her mother has gastroparesis as well. -Continue reglan and scheduled zofran.  -Has not had any symptoms overnight and no emesis since yesterday.  DM II -Well controlled.  Constipation -Resolved.  -Is now having diarrhea. Will stop stool softeners.  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 -Creatinine remained stable in the 1.4-1.5 range.  Hypokalemia/hypomagnesemia -Replete orally.  Code Status: Full Code Family Communication: Patient only  Disposition Plan: Home in 24-48 hours   Consultants:  None   Antibiotics:  None   Subjective: Has been asymptomatic since yesterday  Objective: Filed Vitals:   08/27/14 1633 08/27/14 2300 08/28/14 0705 08/28/14 1525  BP: 120/82 125/95 131/88 125/87  Pulse: 76 101 99 97  Temp: 97.7 F (36.5 C) 98.7 F (37.1 C) 98 F (36.7 C) 98 F (36.7 C)  TempSrc: Oral Oral Oral Oral  Resp: 20 20 20 16   Height:      Weight:   85.68 kg (188 lb 14.2 oz)   SpO2: 100% 100% 99% 98%   No intake or output data in the 24 hours ending 08/28/14 1715 Filed Weights   08/27/14 0527 08/27/14 1107 08/28/14 0705  Weight: 83.054 kg (183 lb 1.6 oz) 84.097 kg (185 lb 6.4 oz) 85.68 kg (188 lb 14.2 oz)    Exam:   General:  AA Ox3  Cardiovascular: RRR, no M/R/G  Respiratory: CTA B  Abdomen: S/NT/ND/+BS  Extremities: no C/C/E   Neurologic:  Intact/non-focal  Data Reviewed: Basic Metabolic Panel:  Recent Labs Lab 08/22/14 08/22/14 2345 08/24/14 1107 08/25/14 1415 08/26/14 0555 08/27/14 0607 08/27/14 0614  NA 139  --  140  --  141  --  140  K 3.1*  --   2.8*  --  3.2*  --  3.2*  CL 97  --  100  --  101  --  99  CO2 30  --  28  --  26  --  27  GLUCOSE 158*  --  142*  --  120*  --  122*  BUN 11  --  8  --  7  --  7  CREATININE 0000000* DUPLICATE REQUEST [id number]*  --  1.52*  --  1.45*  CALCIUM 8.8  --  8.0*  --  8.1*  --  8.2*  MG  --   --   --  1.4*  --  1.3*  --    Liver Function Tests:  Recent Labs Lab 08/22/14  AST 10  ALT 8  ALKPHOS 68  BILITOT 0.5  PROT 7.0  ALBUMIN 3.2*   No results found for this basename: LIPASE, AMYLASE,  in the last 168 hours No results found for this basename: AMMONIA,  in the last 168 hours CBC:  Recent Labs Lab 08/22/14  WBC 9.4  NEUTROABS 6.8  HGB 9.6*  HCT 27.5*  MCV 83.8  PLT 245   Cardiac Enzymes: No results found for this basename: CKTOTAL, CKMB, CKMBINDEX, TROPONINI,  in the last 168 hours BNP (last 3 results)  Recent Labs  08/08/14 0456  PROBNP 2629.0*   CBG:  Recent Labs Lab 08/27/14 1810 08/27/14 2302 08/28/14 0748 08/28/14 1215 08/28/14 1625  GLUCAP 134* 141* 116* 133* 136*  No results found for this or any previous visit (from the past 240 hour(s)).   Studies: No results found.  Scheduled Meds: . carvedilol  6.25 mg Oral BID WC  . docusate sodium  100 mg Oral BID  . enoxaparin (LOVENOX) injection  40 mg Subcutaneous Q24H  . insulin aspart  0-5 Units Subcutaneous QHS  . insulin aspart  0-9 Units Subcutaneous TID WC  . Linaclotide  145 mcg Oral Daily  . metoCLOPramide  10 mg Oral TID AC  . ondansetron (ZOFRAN) IV  4 mg Intravenous 4 times per day  . pantoprazole  40 mg Oral Daily  . polyethylene glycol  17 g Oral Daily   Continuous Infusions:   Principal Problem:   Nausea & vomiting Active Problems:   DM type 2, controlled, with complication   Constipation    Time spent: 35 minutes.  Greater than 50% of this time was spent in direct contact with the patient coordinating care.    Lelon Frohlich  [facility] Pager [phone]  If  7PM-7AM, please contact night-coverage at [email or web address], password [facility] 08/28/2014, 5:15 PM  LOS: 6 days

## 2014-08-29 DIAGNOSIS — N184 Chronic kidney disease, stage 4 (severe): Secondary | ICD-10-CM

## 2014-08-29 DIAGNOSIS — E1122 Type 2 diabetes mellitus with diabetic chronic kidney disease: Secondary | ICD-10-CM

## 2014-08-29 LAB — BASIC METABOLIC PANEL
Anion gap: 11 (ref 5–15)
BUN: 9 mg/dL (ref 6–23)
CO2: 28 mEq/L (ref 19–32)
Calcium: 8.1 mg/dL — ABNORMAL LOW (ref 8.4–10.5)
Chloride: 98 mEq/L (ref 96–112)
Creatinine, Ser: 1.74 mg/dL — ABNORMAL HIGH (ref 0.50–1.10)
GFR calc Af Amer: 46 mL/min — ABNORMAL LOW (ref >90)
GFR calc non Af Amer: 39 mL/min — ABNORMAL LOW (ref >90)
Glucose, Bld: 146 mg/dL — ABNORMAL HIGH (ref 70–99)
Potassium: 3.7 mEq/L (ref 3.7–5.3)
Sodium: 137 mEq/L (ref 137–147)

## 2014-08-29 LAB — GLUCOSE, CAPILLARY
Glucose-Capillary: 157 mg/dL — ABNORMAL HIGH (ref 70–99)
Glucose-Capillary: 137 mg/dL — ABNORMAL HIGH (ref 70–99)

## 2014-08-29 MED ORDER — ONDANSETRON 4 MG PO TBDP: 4.0000 mg | ORAL_TABLET | Freq: Four times a day (QID) | ORAL | Status: DC

## 2014-08-29 MED ORDER — MAGNESIUM OXIDE 400 (241.3 MG) MG PO TABS: 400.0000 mg | ORAL_TABLET | Freq: Three times a day (TID) | ORAL | Status: DC

## 2014-08-29 MED ORDER — METOCLOPRAMIDE HCL 10 MG PO TABS: 10.0000 mg | ORAL_TABLET | Freq: Three times a day (TID) | ORAL | Status: DC

## 2014-08-29 MED ORDER — PANTOPRAZOLE SODIUM 40 MG PO TBEC: 40.0000 mg | DELAYED_RELEASE_TABLET | Freq: Every day | ORAL | Status: DC

## 2014-08-29 NOTE — Discharge Summary (Signed)
Physician Discharge Summary  Diana [name] [id number] DOB: 1985/12/28 DOA: 08/22/2014  PCP: Pcp Not In System  Admit date: 08/22/2014 Discharge date: 08/29/2014  Time spent: 45 minutes  Recommendations for Outpatient Follow-up:  -Advised to followup with her primary care provider in 2 weeks.  -Advise followup with nephrology next week.  Discharge Diagnoses:  Principal Problem:   Nausea & vomiting Active Problems:   DM type 2, controlled, with complication   Constipation   CKD stage 4 due to type 2 diabetes mellitus   Discharge Condition: Stable and improved  Filed Weights   08/27/14 1107 08/28/14 0705 08/29/14 0534  Weight: 84.097 kg (185 lb 6.4 oz) 85.68 kg (188 lb 14.2 oz) 82.373 kg (181 lb 9.6 oz)    History of present illness:  28 yo female with dm2, c/o n/v, No bloody emesis. Pt denies fever, chills, cough, cp, palp, sob, diarrhea, brbpr, black stool. Pt can't recall having gastric emptying study in the past.    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  -Unable to complete GES 10/7 and 10/9 secondary to nausea.  -Will treat presumptively as gastroparesis as this is what I believe she has. Interestingly, her mother has gastroparesis as well.  -Continue reglan and scheduled schedule zofran.  -Has not had any symptoms over the past 48 hours and is tolerating a solid diet.  DM II  -Well controlled.   Constipation  -Resolved.  -Is now having diarrhea. Will stop stool softeners.   Chronic kidney disease stage III  -Secondary to diabetic nephropathy.  -Creatinine remained stable in the 1.4-1.6 range.  -We'll need close followup with nephrology as an outpatient.  Hypokalemia/hypomagnesemia  -Repleted   Procedures:  None   Consultations:  None  Discharge Instructions  Discharge Instructions   Diet Carb Modified    Complete by:  As directed      Increase activity slowly    Complete by:  As directed             Medication List    STOP taking these medications       naproxen sodium 220 MG tablet  Commonly known as:  ANAPROX      TAKE these medications       acetaminophen 500 MG tablet  Commonly known as:  TYLENOL  Take 1,000 mg by mouth daily as needed for moderate pain.     carvedilol 6.25 MG tablet  Commonly known as:  COREG  Take 1 tablet (6.25 mg total) by mouth 2 (two) times daily with a meal.     magnesium oxide 400 (241.3 MG) MG tablet  Commonly known as:  MAG-OX  Take 1 tablet (400 mg total) by mouth 3 (three) times daily.     metFORMIN 500 MG tablet  Commonly known as:  GLUCOPHAGE  Take 500 mg by mouth daily.     metoCLOPramide 10 MG tablet  Commonly known as:  REGLAN  Take 1 tablet (10 mg total) by mouth 4 (four) times daily -  before meals and at bedtime.     ondansetron 4 MG disintegrating tablet  Commonly known as:  ZOFRAN-ODT  Take 1 tablet (4 mg total) by mouth every 6 (six) hours.     pantoprazole 40 MG tablet  Commonly known as:  PROTONIX  Take 1 tablet (40 mg total) by mouth daily.       No Known Allergies     Follow-up Information   Follow up with [facility],  MD. Schedule an appointment as soon as possible for a visit in 2 weeks.   Specialty:  Nephrology   Contact information:   [address] [place] [address] [phone]        The results of significant diagnostics from this hospitalization (including imaging, microbiology, ancillary and laboratory) are listed below for reference.    Significant Diagnostic Studies: Ct Abdomen Pelvis Wo Contrast  08/17/2014   CLINICAL DATA:  Upper abdominal pain. Nausea, vomiting. Go to contrast Plain films 08/12/2014  EXAM: CT ABDOMEN AND PELVIS WITHOUT CONTRAST  TECHNIQUE: Multidetector CT imaging of the abdomen and pelvis was performed following the standard protocol without IV contrast.  COMPARISON:  Plain films 08/12/2014  FINDINGS: There are small bilateral pleural  effusions and pericardial effusion. Heart is mildly enlarged. Linear atelectasis in the left lung base.  Liver, gallbladder, spleen, pancreas, adrenals and kidneys have an unremarkable unenhanced appearance. Stomach, large and small bowel are unremarkable. Aorta is normal caliber. No free fluid, free air or adenopathy.  Uterus, right ovary and urinary bladder have a grossly unremarkable unenhanced appearance. Low-density area within the rib left ovary/adnexa measuring approximately 4 cm, likely cyst. This cannot be characterized on this noncontrast CT.  No acute bony abnormality.  IMPRESSION: No acute findings in the abdomen or pelvis on this unenhanced CT.  Small bilateral pleural effusions and pericardial effusion. Heart borderline enlarged.  4 cm low-density area within the left ovary/adnexae, likely cyst although this cannot be fully characterized on this noncontrast CT.   Electronically Signed   By: Rolm Baptise M.D.   On: 08/17/2014 11:47   Dg Chest 2 View  08/08/2014   CLINICAL DATA:  Cough, congestion, nausea, vomiting and chills.  EXAM: CHEST  2 VIEW  COMPARISON:  None.  FINDINGS: The lungs are well-aerated. Small bilateral pleural effusions are seen. Bibasilar airspace opacification may reflect pulmonary edema or multifocal pneumonia. No pneumothorax is seen.  The heart is borderline normal in size. No acute osseous abnormalities are seen.  IMPRESSION: Small bilateral pleural effusions seen. Bibasilar airspace opacification may reflect pulmonary edema or multifocal pneumonia.   Electronically Signed   By: Garald Balding M.D.   On: 08/08/2014 04:42   Ct Chest W Contrast  08/08/2014   CLINICAL DATA:  Cough and right chest pain. Bibasilar airspace opacity and small bilateral pleural effusions on chest radiographs earlier today.  EXAM: CT CHEST WITH CONTRAST  TECHNIQUE: Multidetector CT imaging of the chest was performed during intravenous contrast administration.  CONTRAST:  4mL OMNIPAQUE IOHEXOL 300  MG/ML  SOLN  COMPARISON:  Chest radiographs obtained earlier today.  FINDINGS: Moderate-sized bilateral pleural effusions. Small pericardial effusion with a maximum thickness of 1.2 cm. Patchy and confluent airspace opacity in both lower lobes and in the right middle lobe. Mild thoracic spine degenerative changes. Unremarkable upper abdomen.  IMPRESSION: 1. Multilobar pneumonia. 2. Moderate-sized bilateral pleural effusions. 3. Small pericardial effusion.   Electronically Signed   By: Enrique Sack M.D.   On: 08/08/2014 08:07   US Renal  08/11/2014   CLINICAL DATA:  Acute kidney injury and renal failure.  EXAM: RENAL/URINARY TRACT ULTRASOUND COMPLETE  COMPARISON:  None.  FINDINGS: Right Kidney:  Length: 13.0. Echogenicity within normal limits. No mass or hydronephrosis visualized.  Left Kidney:  Length: 11.5. Echogenicity within normal limits. No mass or hydronephrosis visualized.  Bladder:  Appears normal for degree of bladder distention.  IMPRESSION: Normal renal ultrasound.   Electronically Signed   By: Aletta Edouard  M.D.   On: 08/11/2014 11:00   Dg Abd 2 Views  08/12/2014   CLINICAL DATA:  Nausea, vomiting.  Abdominal pain.  EXAM: ABDOMEN - 2 VIEW  COMPARISON:  Abdominal radiograph 08/09/2014  FINDINGS: Leads overlie the patient. Small bilateral pleural effusions and heterogeneous opacities within the lung bases. Relative paucity of small bowel gas. No definite evidence for small bowel obstruction. Regional skeleton is unremarkable.  IMPRESSION: Paucity of small bowel gas.  No definite evidence for obstruction.  Bilateral pleural effusions and heterogeneous opacities within the bilateral lung bases.   Electronically Signed   By: Lovey Newcomer M.D.   On: 08/12/2014 19:18   Dg Abd 2 Views  08/09/2014   CLINICAL DATA:  Upper abdominal pain.  EXAM: ABDOMEN - 2 VIEW  COMPARISON:  None.  FINDINGS: The bowel gas pattern is normal. There is no evidence of free air. No radio-opaque calculi or other significant  radiographic abnormality is seen. Mild amount of stool burden is noted in the right colon.  IMPRESSION: No evidence of bowel obstruction or ileus is noted.   Electronically Signed   By: Sabino Dick M.D.   On: 08/09/2014 16:26   Dg Abd Acute W/chest  08/23/2014   CLINICAL DATA:  Initial encounter for shortness breath, nausea, and vomiting for 2 weeks. History of diabetes. Personal history pleural effusion.  EXAM: ACUTE ABDOMEN SERIES (ABDOMEN 2 VIEW & CHEST 1 VIEW)  COMPARISON:  Two-view chest x-ray 08/08/2014.  FINDINGS: Heart is enlarged. Bilateral pleural effusions are near completely resolved.  Supine and upright views the abdomen demonstrate a nonspecific bowel gas pattern. There is no evidence for obstruction or free air. The axial skeleton is within normal limits.  IMPRESSION: 1. Mild cardiomegaly with interval clearing of bilateral pleural effusions. Minimal fluid remains on the left. 2. Negative abdomen.   Electronically Signed   By: Lawrence Santiago M.D.   On: 08/23/2014 01:43   Dg Abd Portable 1v  08/24/2014   CLINICAL DATA:  One week history of nausea, vomiting and constipation. Current history of diabetes.  EXAM: PORTABLE ABDOMEN - 1 VIEW  COMPARISON:  Acute abdomen series yesterday. CT abdomen and pelvis 08/17/2014. Two-view abdomen x-ray 08/12/2014, 08/09/2014.  FINDINGS: Bowel gas pattern unremarkable without evidence of obstruction or significant ileus. Expected stool burden in the colon. No abnormal calcifications. Regional skeleton intact.  IMPRESSION: No acute abdominal abnormality.   Electronically Signed   By: Evangeline Dakin M.D.   On: 08/24/2014 14:49   US Abdomen Limited Ruq  08/13/2014   CLINICAL DATA:  Persistent nausea, vomiting  EXAM: US ABDOMEN LIMITED - RIGHT UPPER QUADRANT  COMPARISON:  None.  FINDINGS: Gallbladder:  No gallstones or wall thickening visualized. No sonographic Murphy sign noted.  Common bile duct:  Diameter: 3.2 mm  Liver:  No focal lesion identified. Within  normal limits in parenchymal echogenicity.  IMPRESSION: Unremarkable right upper quadrant ultrasound.   Electronically Signed   By: Lahoma Crocker M.D.   On: 08/13/2014 09:45    Microbiology: No results found for this or any previous visit (from the past 240 hour(s)).   Labs: Basic Metabolic Panel:  Recent Labs Lab 08/22/14 2345 08/24/14 1107 08/25/14 1415 08/26/14 0555 08/27/14 0607 08/27/14 0614 08/29/14 0620  NA  --  140  --  141  --  140 137  K  --  2.8*  --  3.2*  --  3.2* 3.7  CL  --  100  --  101  --  99  98  CO2  --  28  --  26  --  27 28  GLUCOSE  --  142*  --  120*  --  122* 146*  BUN  --  8  --  7  --  7 9  CREATININE DUPLICATE REQUEST [id number]*  --  1.52*  --  1.45* 1.74*  CALCIUM  --  8.0*  --  8.1*  --  8.2* 8.1*  MG  --   --  1.4*  --  1.3*  --   --    Liver Function Tests: No results found for this basename: AST, ALT, ALKPHOS, BILITOT, PROT, ALBUMIN,  in the last 168 hours No results found for this basename: LIPASE, AMYLASE,  in the last 168 hours No results found for this basename: AMMONIA,  in the last 168 hours CBC: No results found for this basename: WBC, NEUTROABS, HGB, HCT, MCV, PLT,  in the last 168 hours Cardiac Enzymes: No results found for this basename: CKTOTAL, CKMB, CKMBINDEX, TROPONINI,  in the last 168 hours BNP: BNP (last 3 results)  Recent Labs  08/08/14 0456  PROBNP 2629.0*   CBG:  Recent Labs Lab 08/28/14 1215 08/28/14 1625 08/28/14 2037 08/29/14 0735 08/29/14 1200  GLUCAP 133* 136* 180* 157* 137*       Signed:  HERNANDEZ ACOSTA,ESTELA  [facility] Pager: [phone] 08/29/2014, 4:24 PM

## 2014-08-29 NOTE — Progress Notes (Signed)
Patient d/c home with family, no c/o pain at d/c. Discharge instructions, RX's and follow up appts reviewed with patient verbalized understanding. Patient left floor via wheelchair accompanied by staff.

## 2014-08-30 NOTE — Progress Notes (Signed)
UR chart review completed.  

## 2015-08-19 IMAGING — CT CT CHEST W/ CM
2 of 6 series · 13 of 36 positions shown, 16 images · IV contrast (Omnipaque 300)
Comparison: Chest radiographs obtained earlier today.

CLINICAL DATA: Cough and right chest pain. Bibasilar airspace
opacity and small bilateral pleural effusions on chest radiographs
earlier today.

EXAM:
CT CHEST WITH CONTRAST
TECHNIQUE: Multidetector CT imaging of the chest was performed during
intravenous contrast administration.
CONTRAST:  80mL OMNIPAQUE IOHEXOL 300 MG/ML  SOLN

[Series 2: chestroutine 5.0 b40f · axial · 0.68mm/px · z∈[-298,-58]mm · 12 of 54 slices shown, 15 images]
[im 3/54  mediastinal]
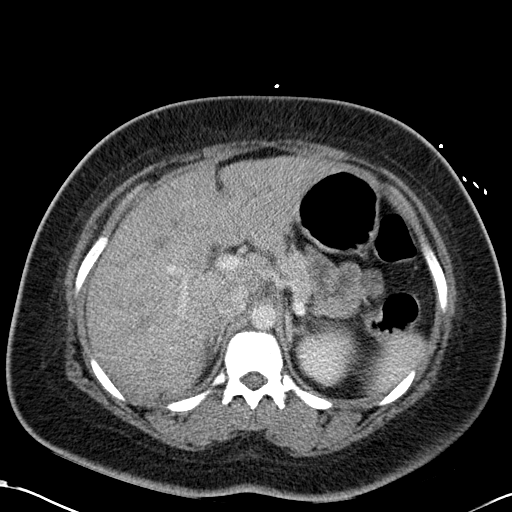
[im 3/54  lung]
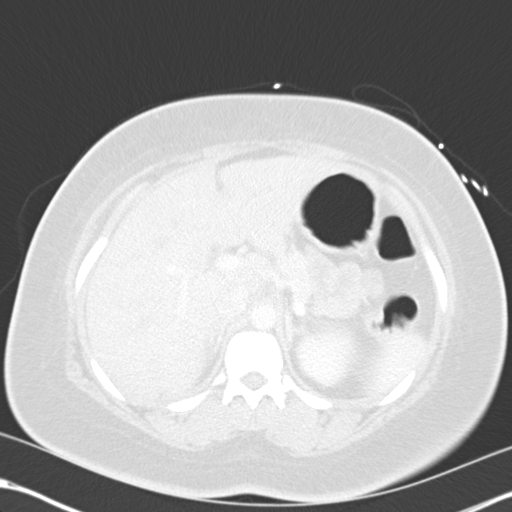
[im 7/54  lung]
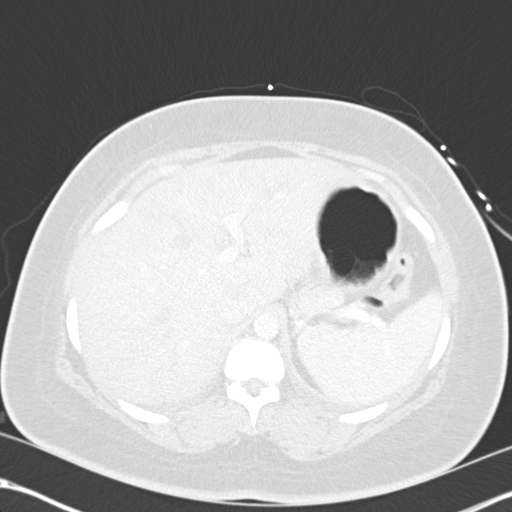
[im 12/54  lung]
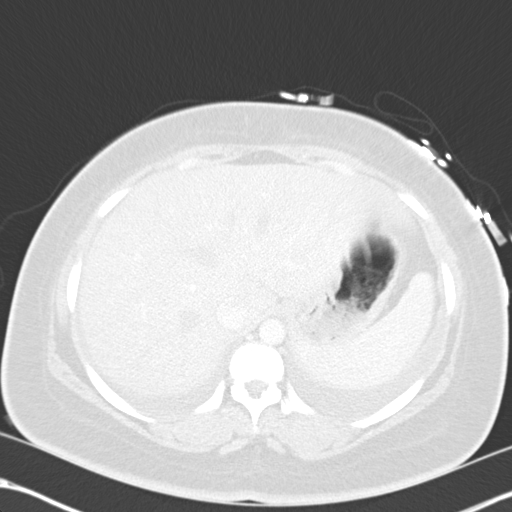
[im 17/54  lung]
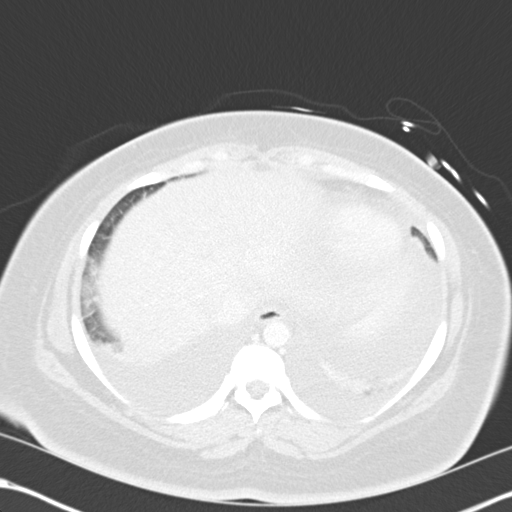
[im 21/54  mediastinal]
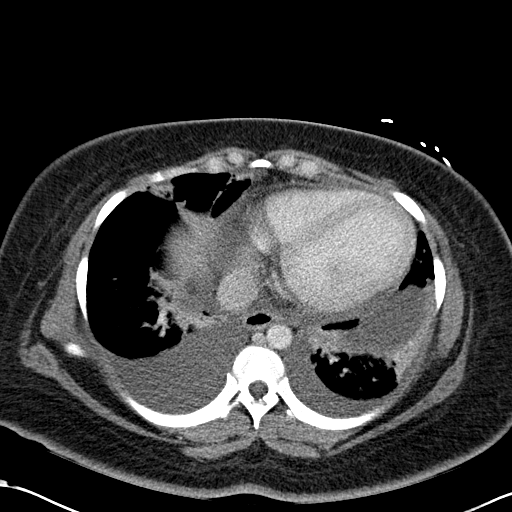
[im 21/54  lung]
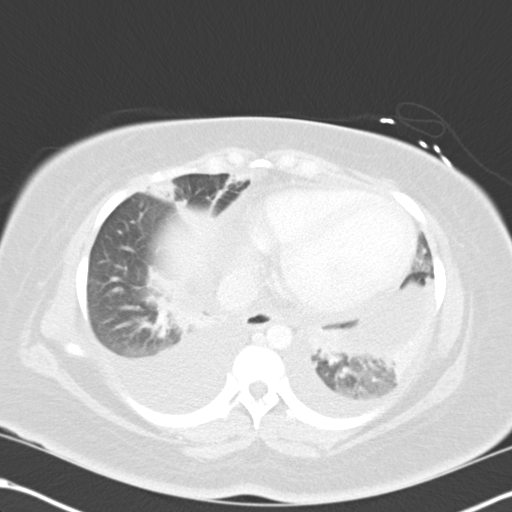
[im 26/54  lung]
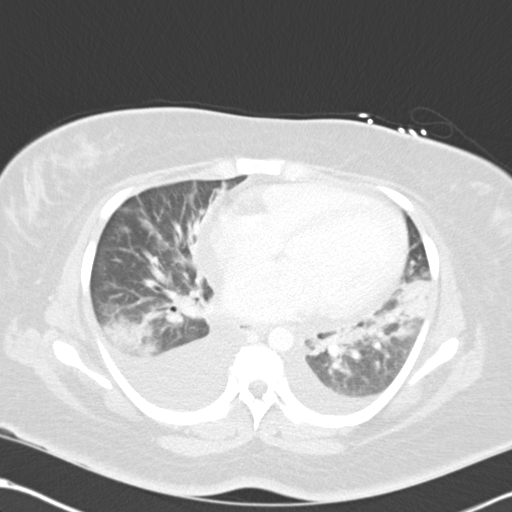
[im 28/54  lung]
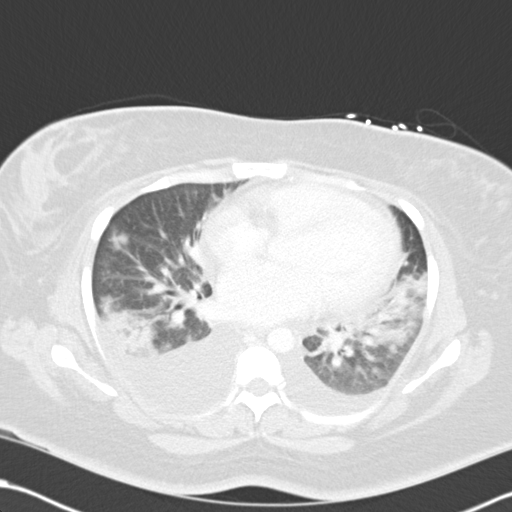
[im 33/54  lung]
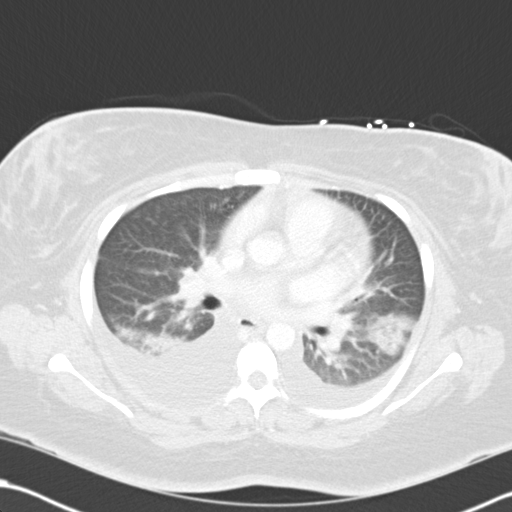
[im 37/54  mediastinal]
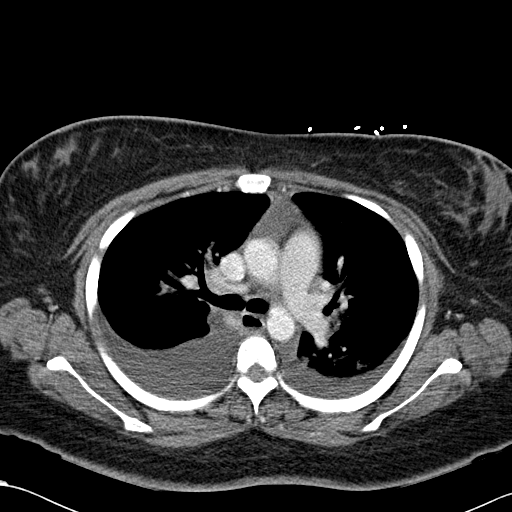
[im 37/54  lung]
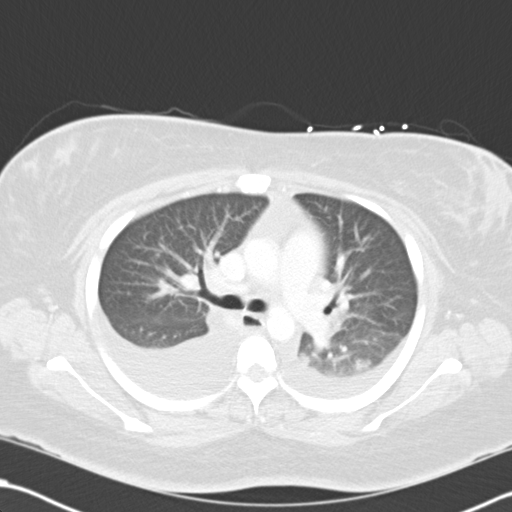
[im 42/54  lung]
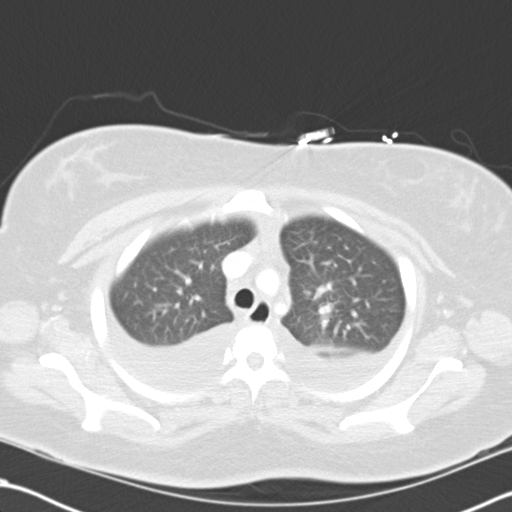
[im 47/54  lung]
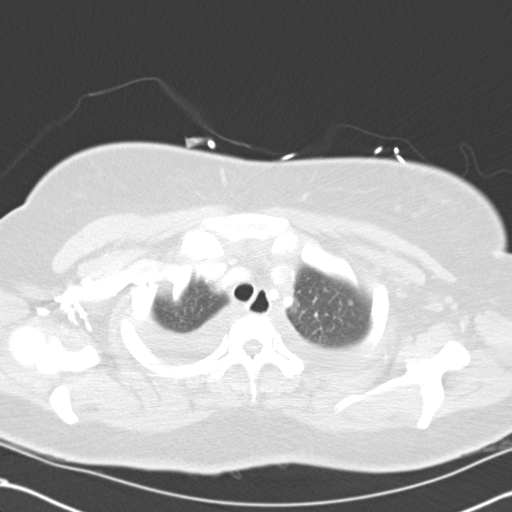
[im 51/54  lung]
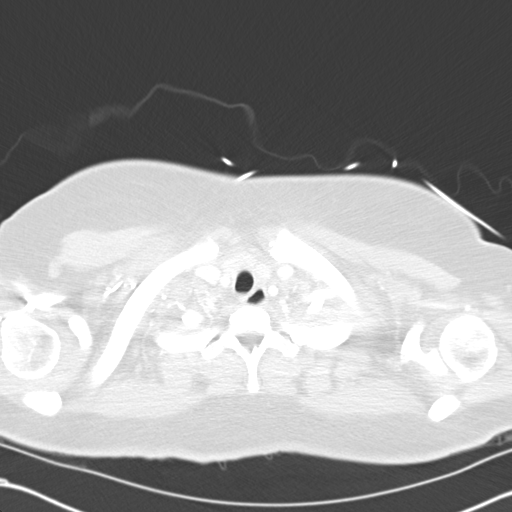

[Series 9: mpr coronal chest 3mm · coronal · 0.16mm/px · 1 of 83 slices shown]
[im 42/83  lung]
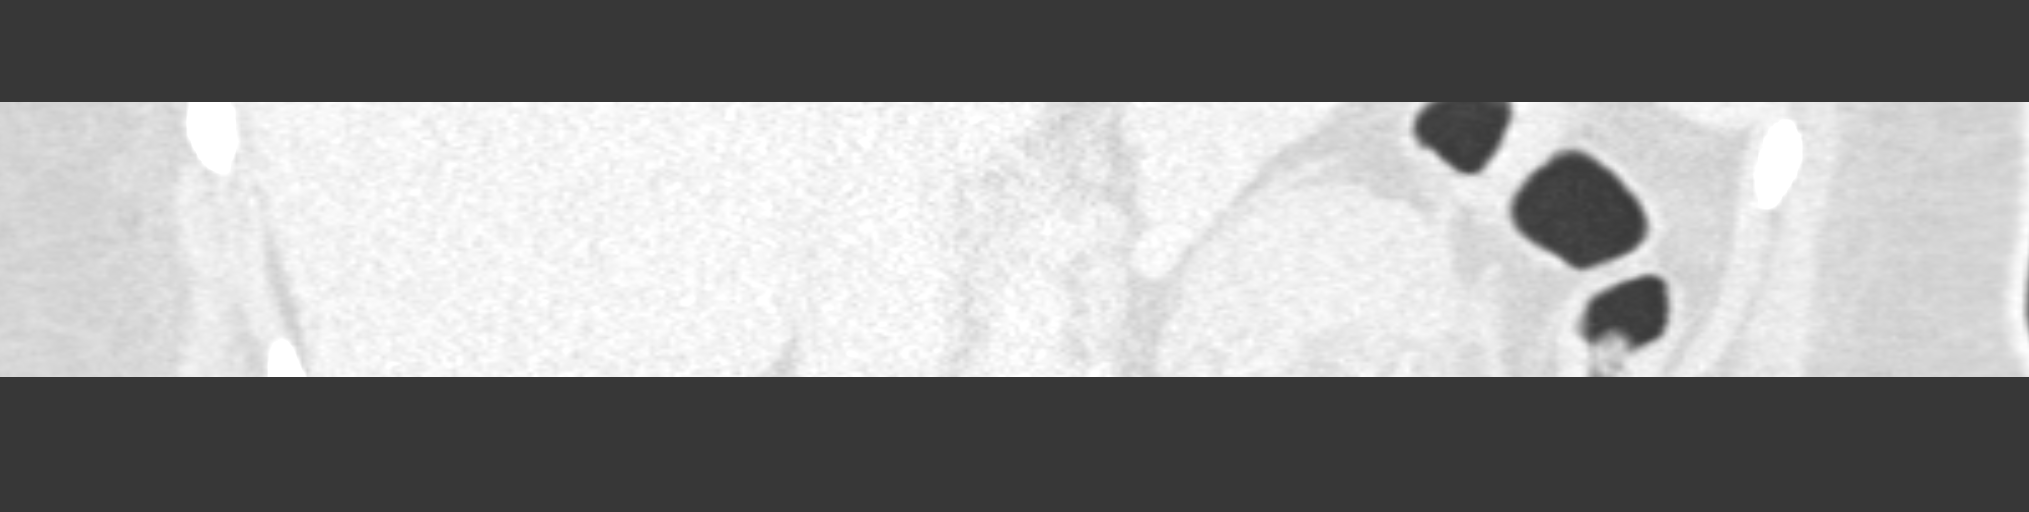

[13 of 36 positions shown; findings below may reference images not displayed]

FINDINGS: Moderate-sized bilateral pleural effusions. Small pericardial
effusion with a maximum thickness of 1.2 cm. Patchy and confluent
airspace opacity in both lower lobes and in the right middle lobe.
Mild thoracic spine degenerative changes. Unremarkable upper
abdomen.
IMPRESSION: 1. Multilobar pneumonia.
2. Moderate-sized bilateral pleural effusions.
3. Small pericardial effusion.

## 2015-08-23 IMAGING — CR DG ABDOMEN 2V
2 series · 2 of 2 positions shown · non-contrast
Comparison: Abdominal radiograph 08/09/2014

CLINICAL DATA: Nausea, vomiting.  Abdominal pain.

EXAM:
ABDOMEN - 2 VIEW

[view not recorded (1 of 2)]
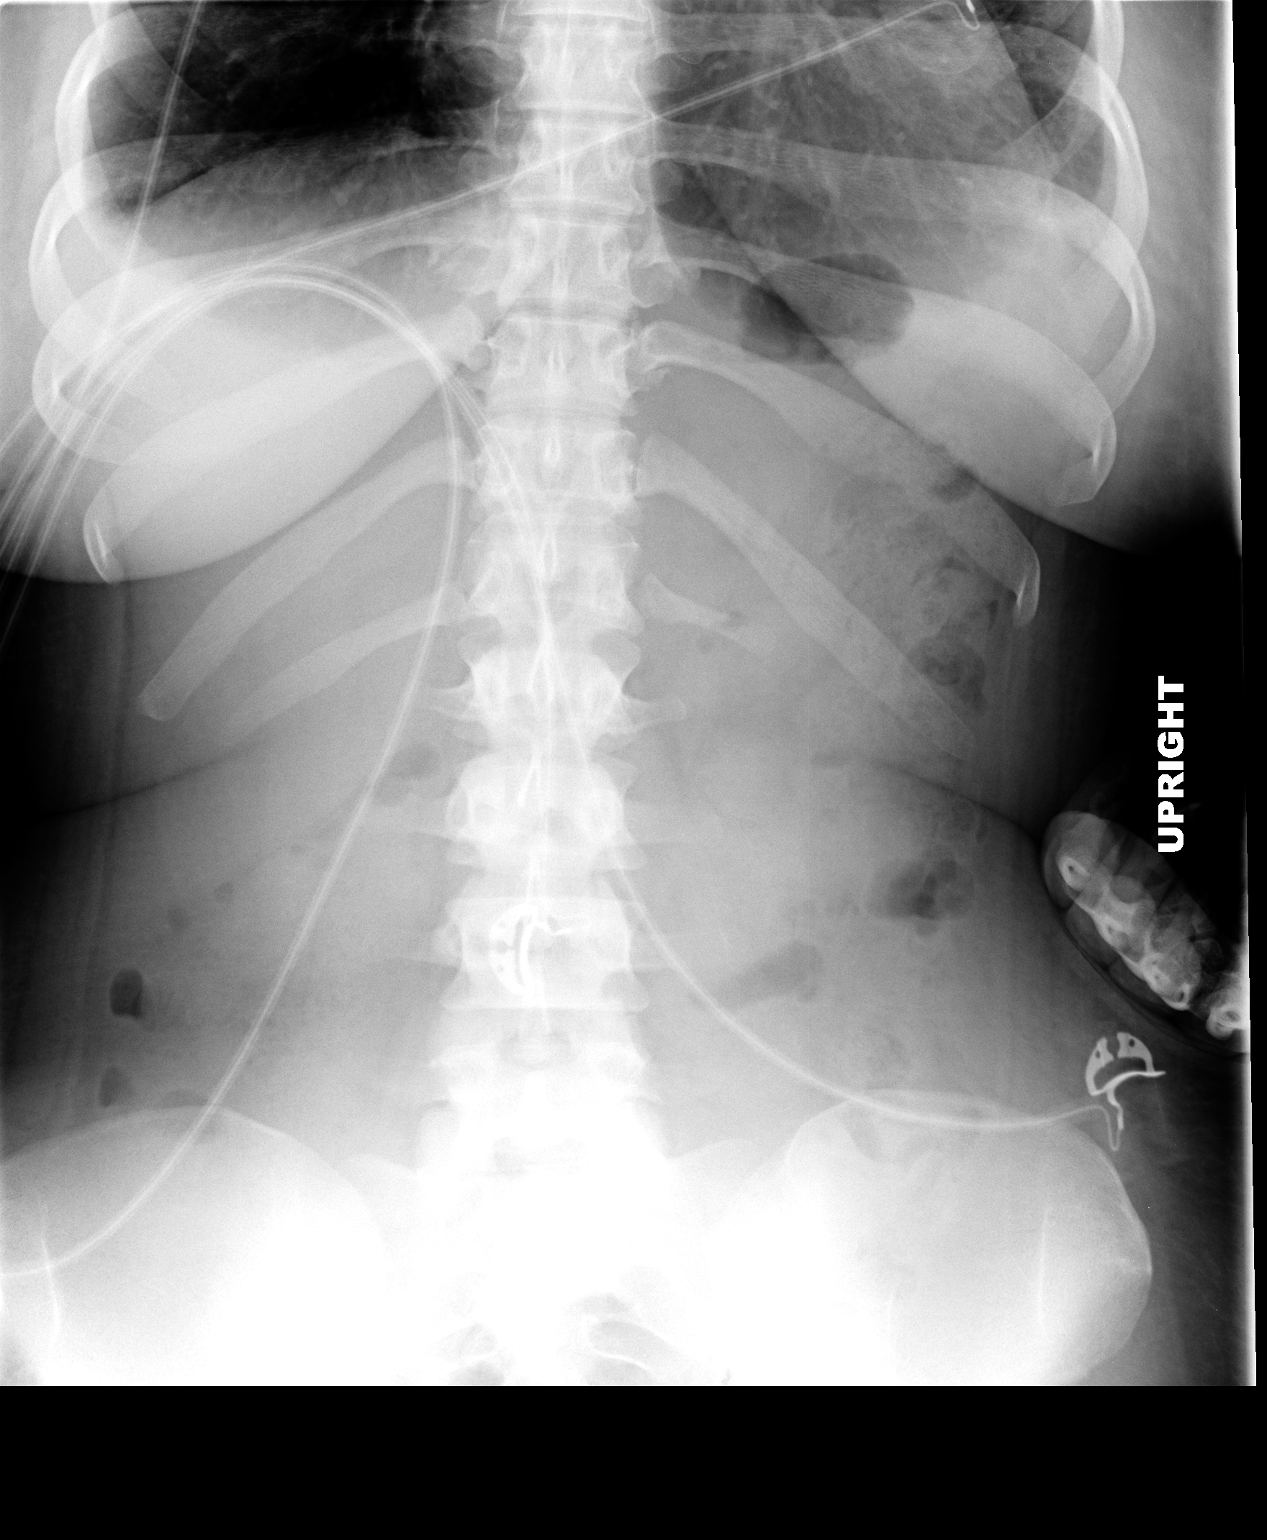

[view not recorded (2 of 2)]
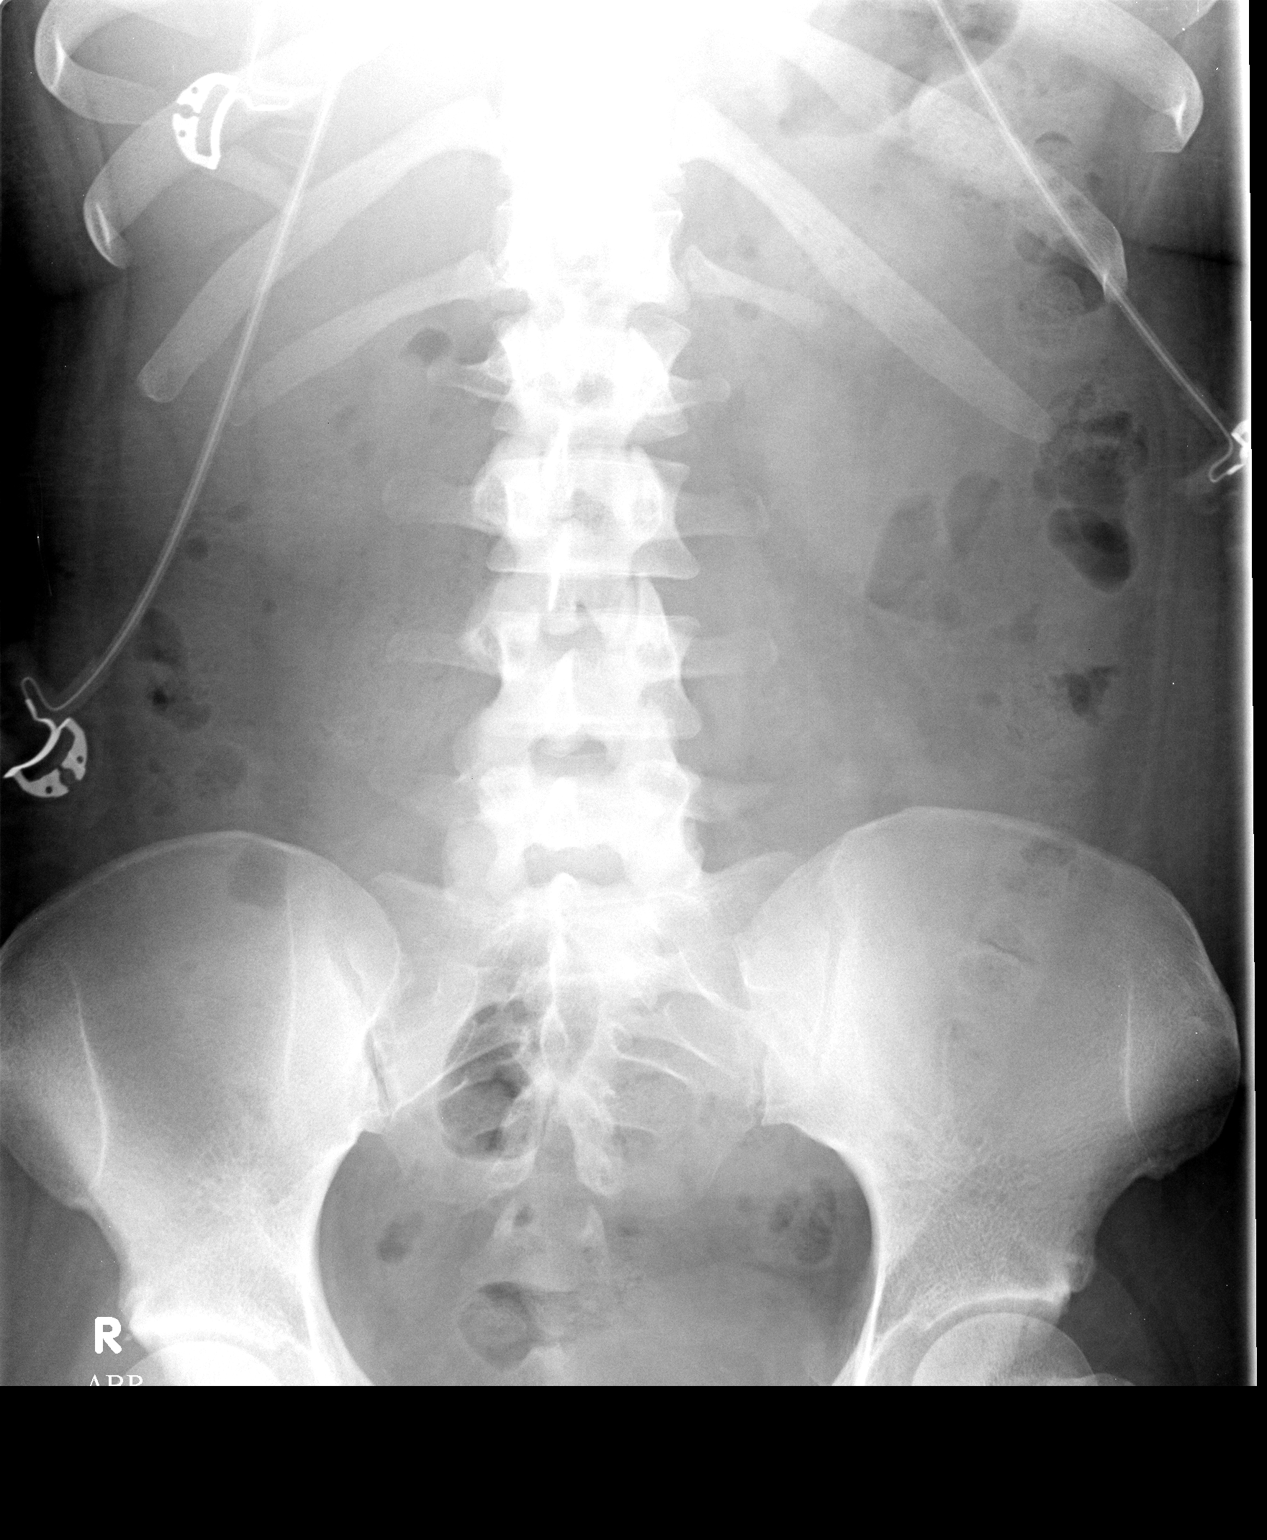

[2 of 2 positions shown; findings below may reference images not displayed]

FINDINGS: Leads overlie the patient. Small bilateral pleural effusions and
heterogeneous opacities within the lung bases. Relative paucity of
small bowel gas. No definite evidence for small bowel obstruction.
Regional skeleton is unremarkable.
IMPRESSION: Paucity of small bowel gas.

No definite evidence for obstruction.

Bilateral pleural effusions and heterogeneous opacities within the
bilateral lung bases.

## 2015-08-24 IMAGING — US US ABDOMEN LIMITED
1 series · 14 of 25 positions shown · non-contrast
Comparison: None.

CLINICAL DATA: Persistent nausea, vomiting

EXAM:
US ABDOMEN LIMITED - RIGHT UPPER QUADRANT

[Series 1: us abdomen limited · 0.26mm/px · 14 of 47 slices shown]
[im 1/47]
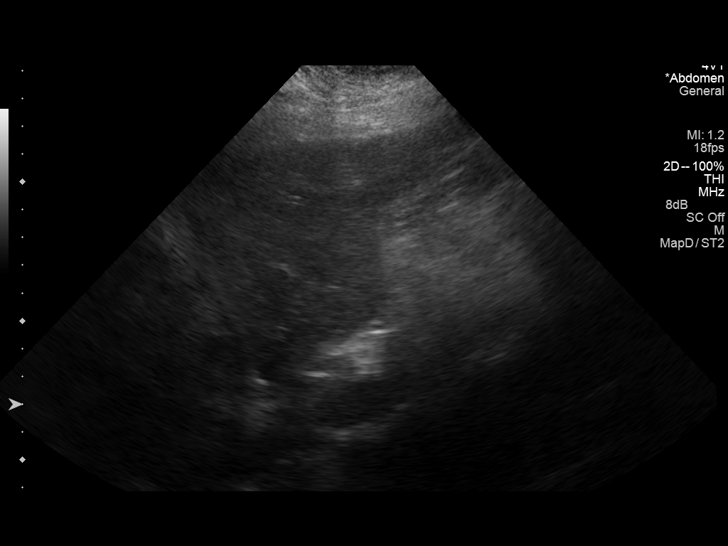
[im 4/47]
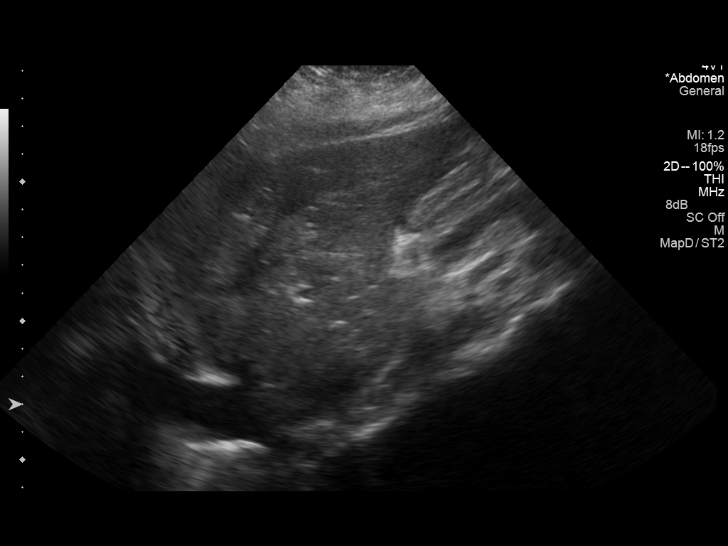
[im 8/47]
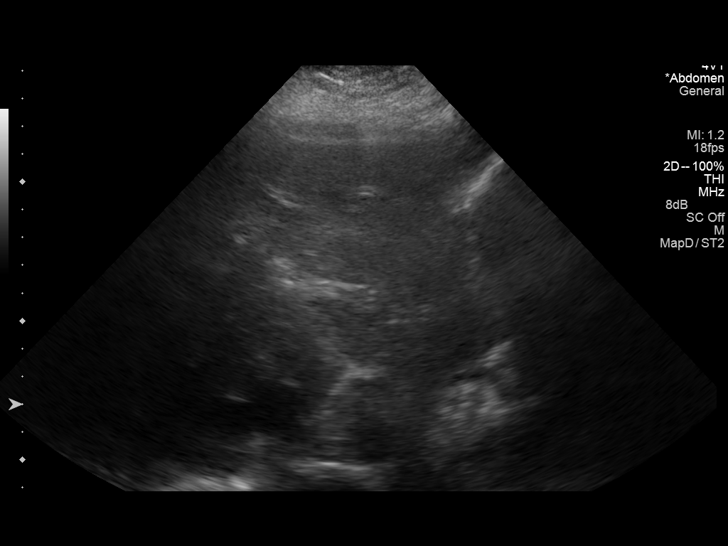
[im 12/47]
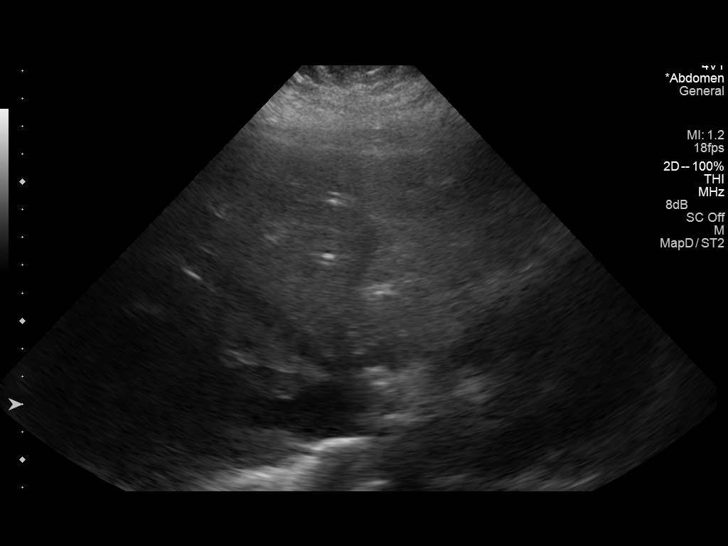
[im 16/47]
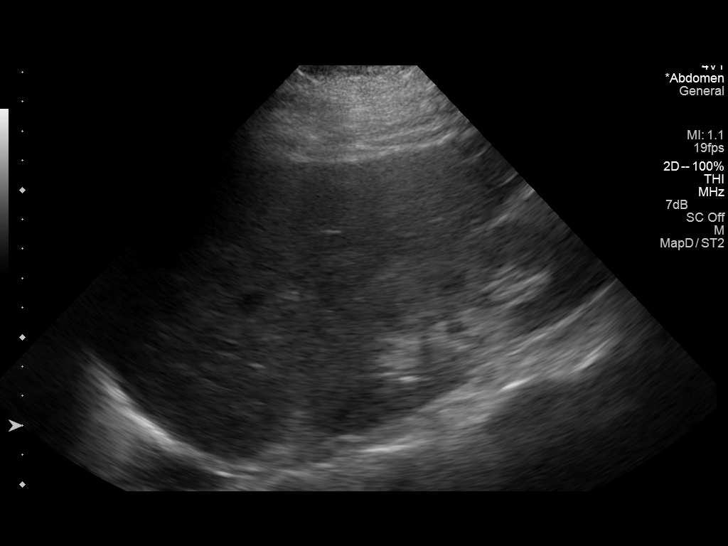
[im 18/47]
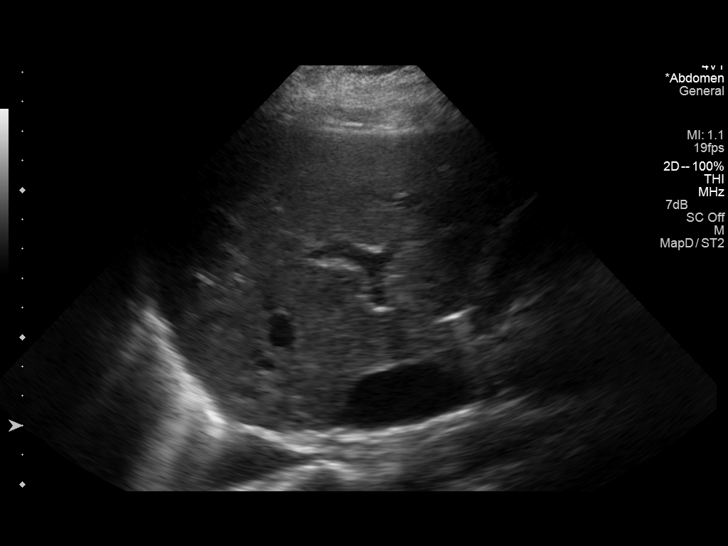
[im 22/47]
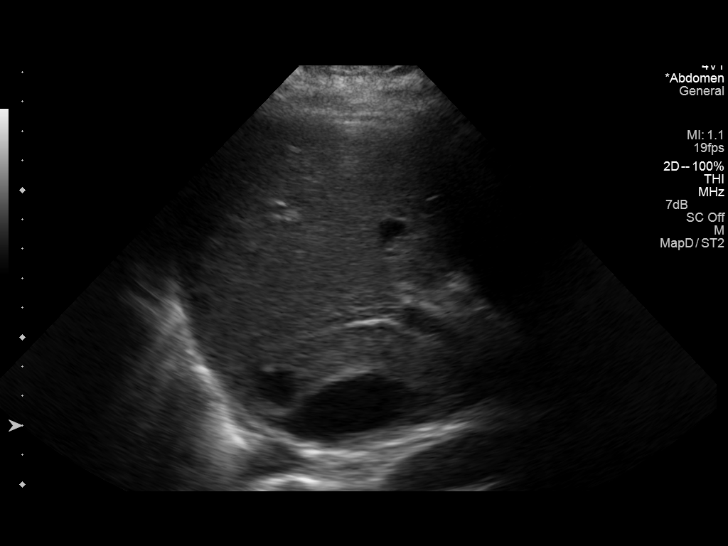
[im 25/47]
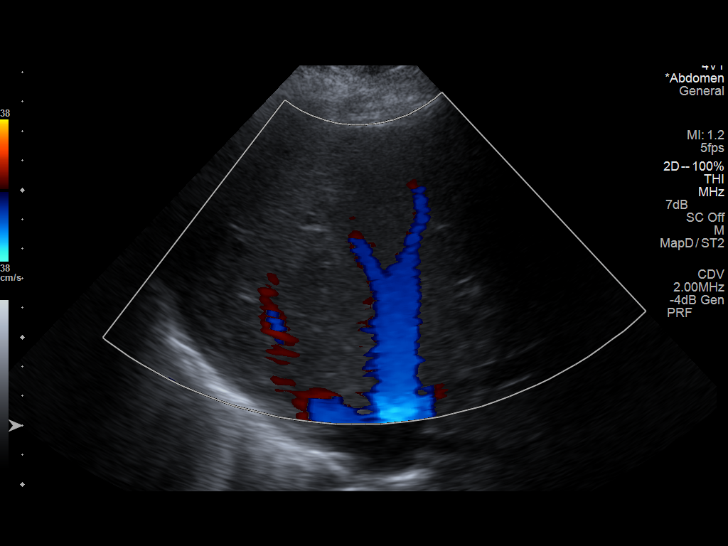
[im 29/47]
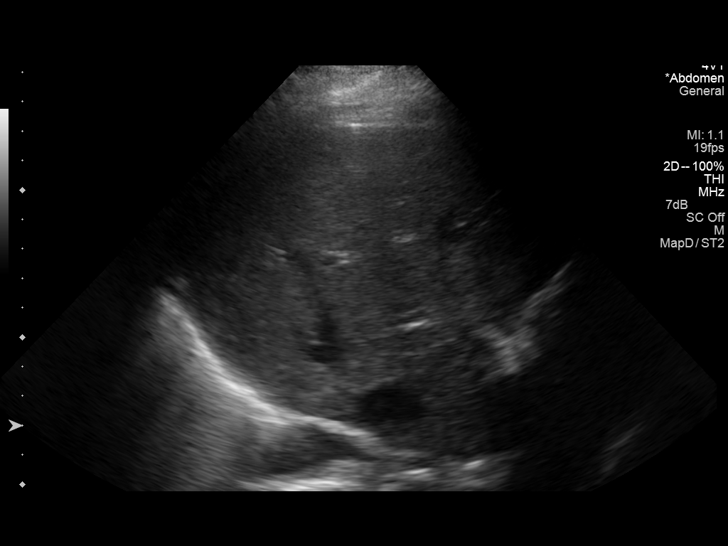
[im 31/47]
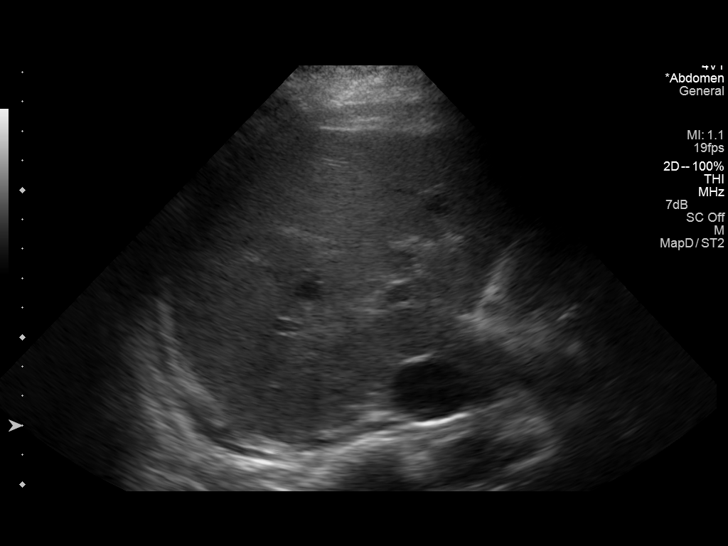
[im 35/47]
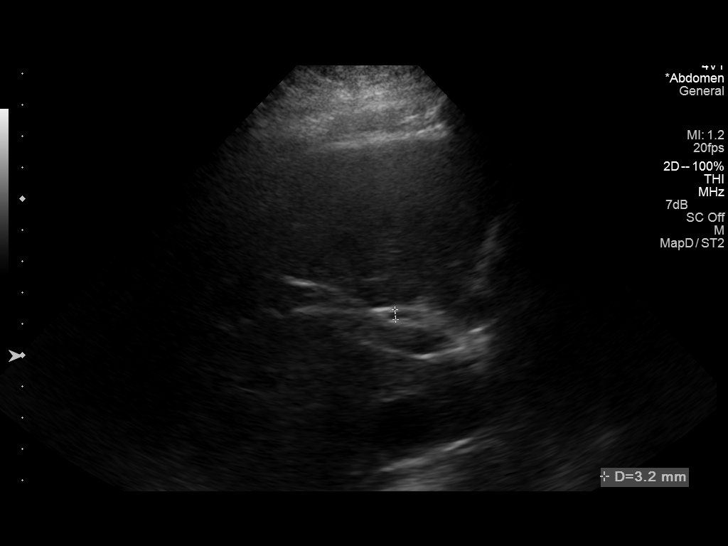
[im 39/47]
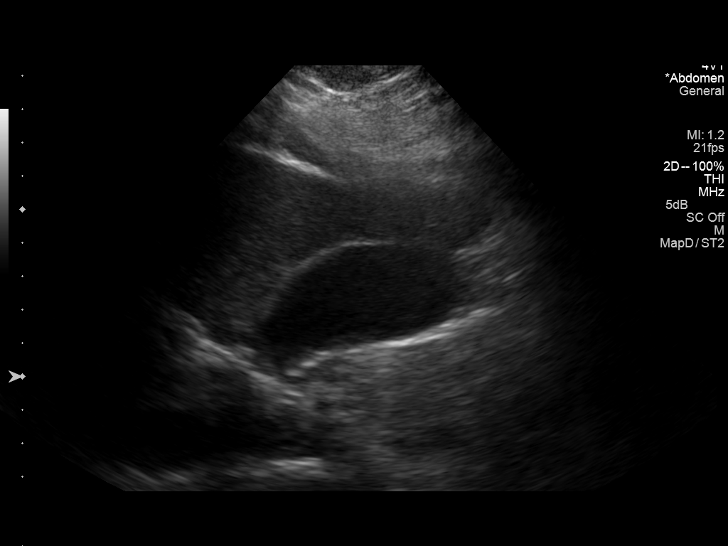
[im 43/47]
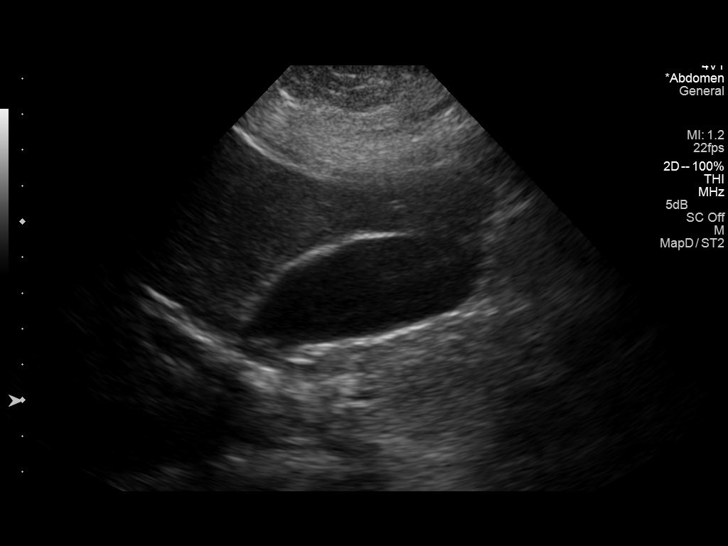
[im 47/47]
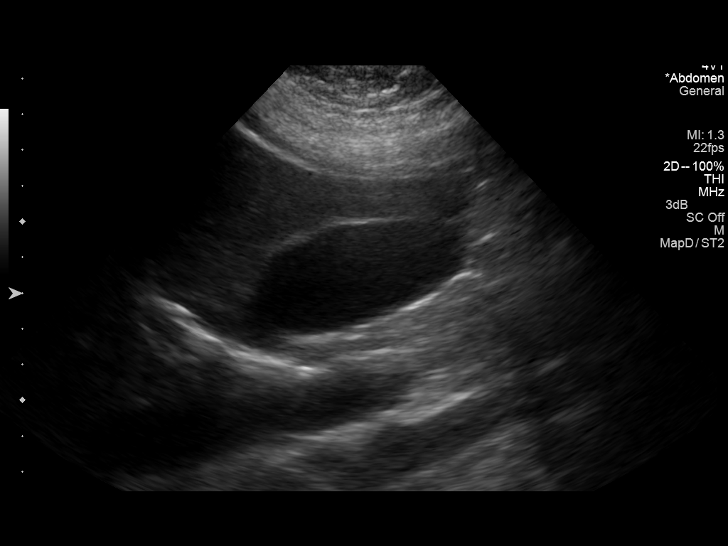

[14 of 25 positions shown; findings below may reference images not displayed]

FINDINGS: Gallbladder:

No gallstones or wall thickening visualized. No sonographic Murphy
sign noted.

Common bile duct:

Diameter: 3.2 mm

Liver:

No focal lesion identified. Within normal limits in parenchymal
echogenicity.
IMPRESSION: Unremarkable right upper quadrant ultrasound.

## 2015-09-04 IMAGING — CR DG ABD PORTABLE 1V
1 series · 1 of 1 positions shown · non-contrast
Comparison: Acute abdomen series yesterday. CT abdomen and pelvis
08/17/2014. Two-view abdomen x-ray 08/12/2014, 08/09/2014.

CLINICAL DATA: One week history of nausea, vomiting and
constipation. Current history of diabetes.

EXAM:
PORTABLE ABDOMEN - 1 VIEW

[supine ap]
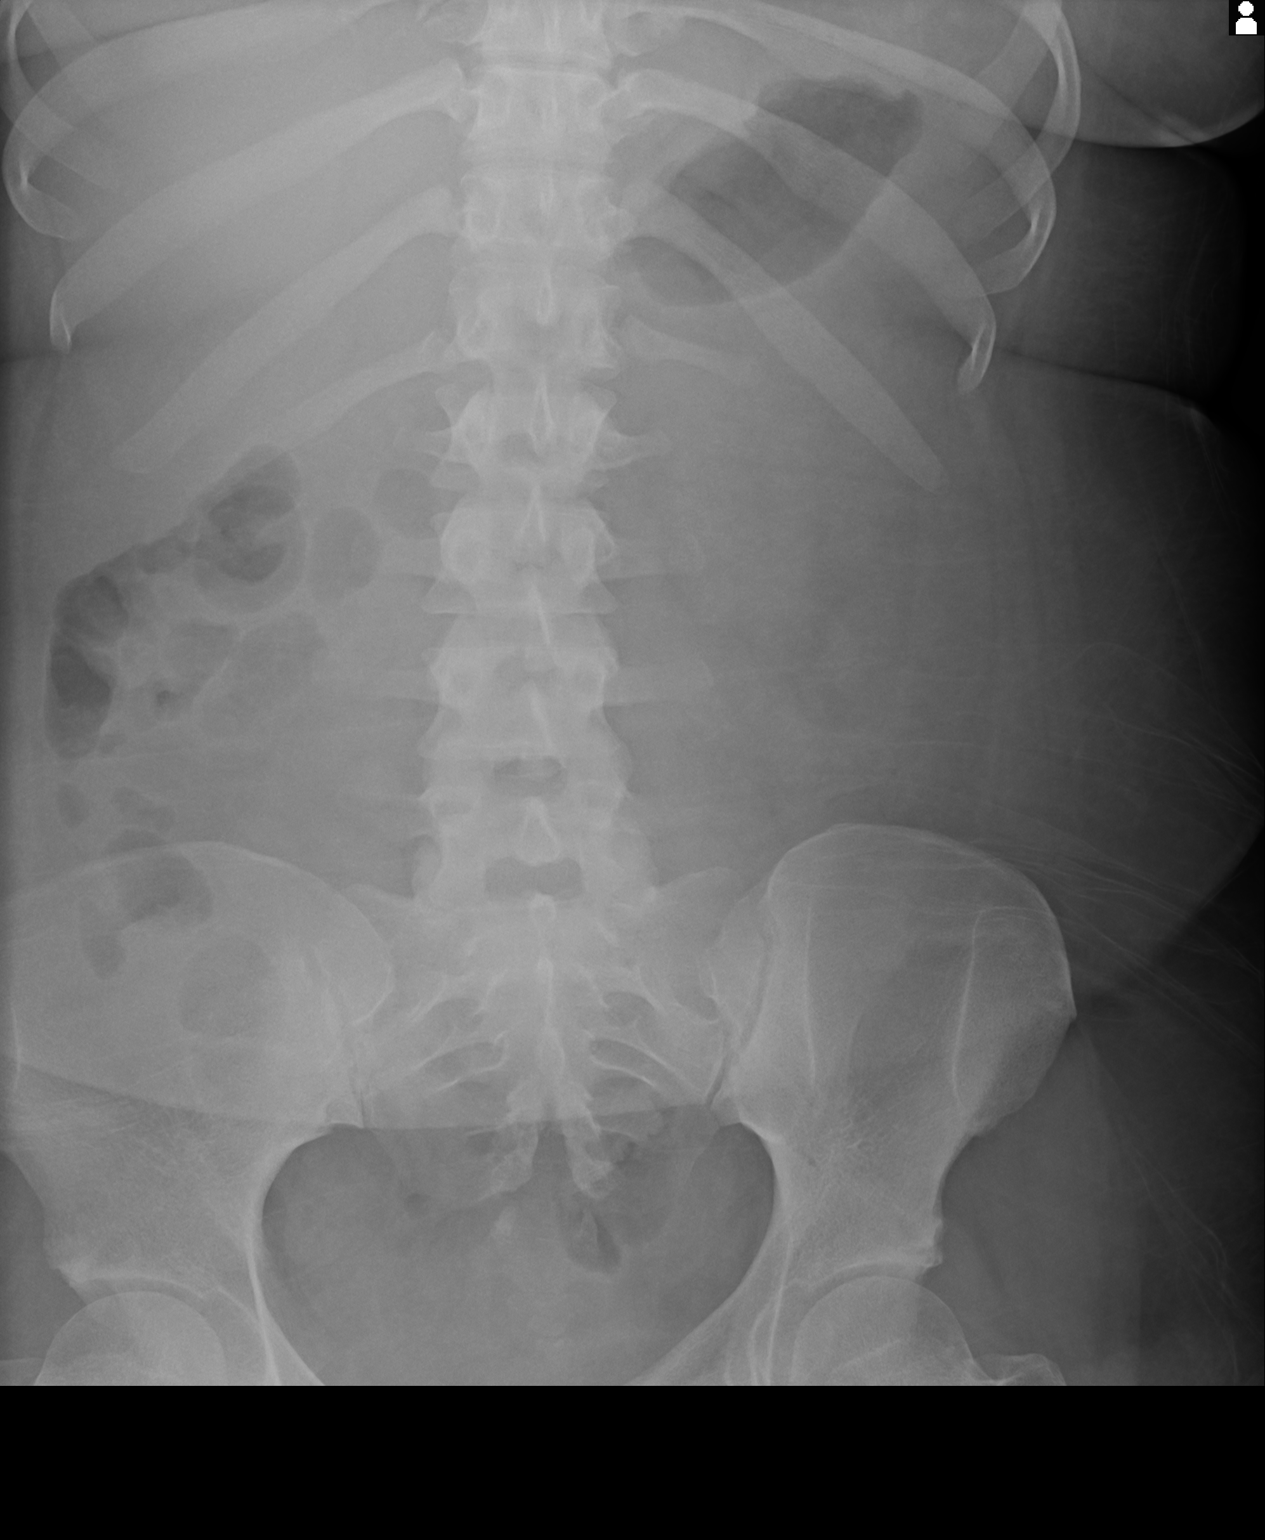

[1 of 1 positions shown; findings below may reference images not displayed]

FINDINGS: Bowel gas pattern unremarkable without evidence of obstruction or
significant ileus. Expected stool burden in the colon. No abnormal
calcifications. Regional skeleton intact.
IMPRESSION: No acute abdominal abnormality.

## 2016-02-04 ENCOUNTER — Emergency Department (HOSPITAL_COMMUNITY)
Admission: EM | Admit: 2016-02-04 | Discharge: 2016-02-05 | Disposition: A | Discharge status: Home / Self Care | Payer: Medicaid - Out of State | Attending: Emergency Medicine | Admitting: Emergency Medicine

## 2016-02-04 ENCOUNTER — Emergency Department (HOSPITAL_COMMUNITY): Payer: Medicaid - Out of State

## 2016-02-04 ENCOUNTER — Encounter (HOSPITAL_COMMUNITY): Payer: Self-pay | Admitting: *Deleted

## 2016-02-04 DIAGNOSIS — K529 Noninfective gastroenteritis and colitis, unspecified: Secondary | ICD-10-CM | POA: Diagnosis not present

## 2016-02-04 DIAGNOSIS — Z79899 Other long term (current) drug therapy: Secondary | ICD-10-CM | POA: Diagnosis not present

## 2016-02-04 DIAGNOSIS — R1084 Generalized abdominal pain: Secondary | ICD-10-CM | POA: Diagnosis present

## 2016-02-04 DIAGNOSIS — R0602 Shortness of breath: Secondary | ICD-10-CM | POA: Insufficient documentation

## 2016-02-04 DIAGNOSIS — I5021 Acute systolic (congestive) heart failure: Secondary | ICD-10-CM | POA: Insufficient documentation

## 2016-02-04 DIAGNOSIS — E119 Type 2 diabetes mellitus without complications: Secondary | ICD-10-CM | POA: Insufficient documentation

## 2016-02-04 HISTORY — DX: Heart failure, unspecified: I50.9

## 2016-02-04 LAB — CBC WITH DIFFERENTIAL/PLATELET
Basophils Absolute: 0 10*3/uL (ref 0.0–0.1)
Basophils Relative: 0 %
Eosinophils Absolute: 0.1 10*3/uL (ref 0.0–0.7)
Eosinophils Relative: 2 %
HCT: 29.6 % — ABNORMAL LOW (ref 36.0–46.0)
Hemoglobin: 9.6 g/dL — ABNORMAL LOW (ref 12.0–15.0)
Lymphocytes Relative: 22 %
Lymphs Abs: 1.5 10*3/uL (ref 0.7–4.0)
MCH: 28.1 pg (ref 26.0–34.0)
MCHC: 32.4 g/dL (ref 30.0–36.0)
MCV: 86.5 fL (ref 78.0–100.0)
Monocytes Absolute: 0.3 10*3/uL (ref 0.1–1.0)
Monocytes Relative: 4 %
Neutro Abs: 5 10*3/uL (ref 1.7–7.7)
Neutrophils Relative %: 72 %
Platelets: 372 10*3/uL (ref 150–400)
RBC: 3.42 MIL/uL — ABNORMAL LOW (ref 3.87–5.11)
RDW: 14.6 % (ref 11.5–15.5)
WBC: 6.9 10*3/uL (ref 4.0–10.5)

## 2016-02-04 LAB — COMPREHENSIVE METABOLIC PANEL
ALT: 14 U/L (ref 14–54)
AST: 20 U/L (ref 15–41)
Albumin: 3.2 g/dL — ABNORMAL LOW (ref 3.5–5.0)
Alkaline Phosphatase: 121 U/L (ref 38–126)
Anion gap: 10 (ref 5–15)
BUN: 52 mg/dL — ABNORMAL HIGH (ref 6–20)
CO2: 23 mmol/L (ref 22–32)
Calcium: 8.8 mg/dL — ABNORMAL LOW (ref 8.9–10.3)
Chloride: 104 mmol/L (ref 101–111)
Creatinine, Ser: 2.3 mg/dL — ABNORMAL HIGH (ref 0.44–1.00)
GFR calc Af Amer: 32 mL/min — ABNORMAL LOW (ref >60)
GFR calc non Af Amer: 28 mL/min — ABNORMAL LOW (ref >60)
Glucose, Bld: 178 mg/dL — ABNORMAL HIGH (ref 65–99)
Potassium: 4.6 mmol/L (ref 3.5–5.1)
Sodium: 137 mmol/L (ref 135–145)
Total Bilirubin: 0.6 mg/dL (ref 0.3–1.2)
Total Protein: 8.2 g/dL — ABNORMAL HIGH (ref 6.5–8.1)

## 2016-02-04 LAB — POC URINE PREG, ED: Preg Test, Ur: NEGATIVE

## 2016-02-04 MED ORDER — ONDANSETRON HCL 4 MG/2ML IJ SOLN
4.0000 mg | Freq: Once | INTRAMUSCULAR | Status: AC
  Administered 2016-02-04: 4 mg via INTRAVENOUS
  Filled 2016-02-04: qty 2

## 2016-02-04 MED ORDER — ONDANSETRON 4 MG PO TBDP: ORAL_TABLET | ORAL | Status: DC

## 2016-02-04 NOTE — Discharge Instructions (Signed)
Drink plenty of fluids and take imodium for diarrhea and follow up with your family md next week

## 2016-02-04 NOTE — ED Provider Notes (Signed)
CSN: [id number]     Arrival date & time 02/04/16  2127 History  By signing my name below, I, Helane Gunther, attest that this documentation has been prepared under the direction and in the presence of Milton Ferguson, MD. Electronically Signed: Helane Gunther, ED Scribe. 02/04/2016. 10:08 PM.    Chief Complaint  Patient presents with  . Abdominal Pain   Patient is a 30 y.o. female presenting with abdominal pain. The history is provided by the patient and a parent. No language interpreter was used.  Abdominal Pain Pain location:  Generalized Pain quality: cramping   Pain severity:  Moderate Onset quality:  Gradual Progression:  Worsening Chronicity:  New Context: retching   Context: not sick contacts   Relieved by:  Nothing Associated symptoms: diarrhea, nausea, shortness of breath and vomiting   Associated symptoms: no chest pain, no cough, no fatigue and no hematuria   Diarrhea:    Number of occurrences:  4   Severity:  Mild   Timing:  Intermittent   Progression:  Unchanged Nausea:    Severity:  Moderate   Onset quality:  Gradual   Timing:  Constant   Progression:  Unchanged Shortness of breath:    Severity:  Mild   Onset quality:  Gradual Vomiting:    Severity:  Mild   Timing:  Intermittent  HPI Comments: Diana [name] is a 30 y.o. female with a PMHX of CHF, acute systolic heart failure, sinus tachycardia, pleural effusion, acute kidney injury, HCAP, and DM as well as a PSHx of appendectomy who presents to the Emergency Department complaining of worsening, cramping, generalized abdominal pain onset today. She reports associated SOB, nausea, vomiting, and diarrhea (4x last night). Per parents, pt has not had any recent sick contacts.   Past Medical History  Diagnosis Date  . Diabetes mellitus without complication ([place])   . HCAP (healthcare-associated pneumonia)   . Pleural effusion     bilateral  . Acute systolic heart failure ([place])   . Acute kidney injury ([place])    . Sinus tachycardia ([place])   . Obesity (BMI 30-39.9)     bmi 37.6  . CHF (congestive heart failure) [facility])    Past Surgical History  Procedure Laterality Date  . Appendectomy    . Esophagogastroduodenoscopy N/A 08/16/2014    Procedure: ESOPHAGOGASTRODUODENOSCOPY (EGD);  Surgeon: Daneil Dolin, MD;  Location: [facility] ENDO SUITE;  Service: Endoscopy;  Laterality: N/A;   History reviewed. No pertinent family history. Social History  Substance Use Topics  . Smoking status: Never Smoker   . Smokeless tobacco: None  . Alcohol Use: No   OB History    No data available     Review of Systems  Constitutional: Negative for appetite change and fatigue.  HENT: Negative for congestion, ear discharge and sinus pressure.   Eyes: Negative for discharge.  Respiratory: Positive for shortness of breath. Negative for cough.   Cardiovascular: Negative for chest pain.  Gastrointestinal: Positive for nausea, vomiting, abdominal pain and diarrhea.  Genitourinary: Negative for frequency and hematuria.  Musculoskeletal: Negative for back pain.  Skin: Negative for rash.  Neurological: Negative for seizures and headaches.  Psychiatric/Behavioral: Negative for hallucinations.    Allergies  Review of patient's allergies indicates no known allergies.  Home Medications   Prior to Admission medications   Medication Sig Start Date End Date Taking? Authorizing Provider  acetaminophen (TYLENOL) 500 MG tablet Take 1,000 mg by mouth daily as needed for moderate pain.   Yes Historical Provider, MD  carvedilol (COREG) 6.25 MG tablet Take 1 tablet (6.25 mg total) by mouth 2 (two) times daily with a meal. 08/19/14  Yes Lezlie Octave Black, NP  ondansetron (ZOFRAN ODT) 4 MG disintegrating tablet 4mg  ODT q4 hours prn nausea/vomit 02/04/16   Milton Ferguson, MD   BP 101/78 mmHg  Pulse 101  Temp(Src) 97.8 F (36.6 C) (Oral)  Resp 18  Ht 5\' 3"  (1.6 m)  Wt 181 lb (82.101 kg)  BMI 32.07 kg/m2  SpO2 97%  LMP  01/09/2016 Physical Exam  Constitutional: She is oriented to person, place, and time. She appears well-developed.  HENT:  Head: Normocephalic.  Eyes: Conjunctivae and EOM are normal. No scleral icterus.  Neck: Neck supple. No thyromegaly present.  Cardiovascular: Normal rate and regular rhythm.  Exam reveals no gallop and no friction rub.   No murmur heard. Pulmonary/Chest: No stridor. She has no wheezes. She has no rales. She exhibits no tenderness.  Abdominal: She exhibits no distension. There is tenderness. There is no rebound.  Minor TTP throughout the entire abdomen  Musculoskeletal: Normal range of motion. She exhibits no edema.  Lymphadenopathy:    She has no cervical adenopathy.  Neurological: She is alert and oriented to person, place, and time. She exhibits normal muscle tone. Coordination normal.  Skin: No rash noted. No erythema.  Psychiatric: She has a normal mood and affect. Her behavior is normal.    ED Course  Procedures  DIAGNOSTIC STUDIES: Oxygen Saturation is 97% on RA, adequate by my interpretation.    COORDINATION OF CARE: 10:03 PM - Discussed plans to order diagnostic studies and imaging, as well as anti-nausea medication. Pt advised of plan for treatment and pt agrees.  Labs Review Labs Reviewed  CBC WITH DIFFERENTIAL/PLATELET - Abnormal; Notable for the following:    RBC 3.42 (*)    Hemoglobin 9.6 (*)    HCT 29.6 (*)    All other components within normal limits  COMPREHENSIVE METABOLIC PANEL - Abnormal; Notable for the following:    Glucose, Bld 178 (*)    BUN 52 (*)    Creatinine, Ser 2.30 (*)    Calcium 8.8 (*)    Total Protein 8.2 (*)    Albumin 3.2 (*)    GFR calc non Af Amer 28 (*)    GFR calc Af Amer 32 (*)    All other components within normal limits  POC URINE PREG, ED    Imaging Review Dg Abd Acute W/chest  02/04/2016  CLINICAL DATA:  PATIENT STATES " ABDOMINAL PAIN, CHEST PAIN, NAUSEA, VOMITING, DIARRHEA SINCE YESTERDAY" HISTORY OF  CHF, DM, HCAP, PLEURAL EFFUSION, SINUS TACHYCARDIA, ACUTE SYSTOLIC HEART FAILURE EXAM: DG ABDOMEN ACUTE W/ 1V CHEST COMPARISON:  01/11/2015 FINDINGS: The cardio -pericardial silhouette is enlarged, consistent with large pericardial effusion. Left chest wall pacemaker lead overlies the cardiac apex. Right-sided subclavian central venous line tip overlies the level of superior vena cava. The lungs are free of focal consolidations and pleural effusions. Bowel gas pattern is nonobstructive. There is moderate stool within nondilated loops of colon. Liver contour appears enlarged. IMPRESSION: 1. Marked enlargement of the cardiac silhouette, consistent with pericardial effusion. 2. Suspect hepatomegaly. 3. Moderate stool burden. Electronically Signed   By: Nolon Nations M.D.   On: 02/04/2016 22:47   I have personally reviewed and evaluated these images and lab results as part of my medical decision-making.   EKG Interpretation   Date/Time:  Saturday February 04 2016 21:54:54 EDT Ventricular Rate:  106 PR Interval:  147 QRS Duration: 63 QT Interval:  383 QTC Calculation: 509 R Axis:   87 Text Interpretation:  Sinus tachycardia Probable left atrial enlargement  Borderline T abnormalities, lateral leads Prolonged QT interval Confirmed  by Daneesha Quinteros  MD, Rissa Turley [id number]) on 02/04/2016 10:37:58 PM      MDM   Final diagnoses:  Gastroenteritis    Patient with nausea vomiting and diarrhea. Chemistry shows some dehydration. Patient states nausea improved with Zofran also or shortness of breath improved with Zofran. Suspect her congestive heart failure is stable. She wants to go home with Zofran and follow-up with her PCP. Patient also take Imodium for diarrhea  The chart was scribed for me under my direct supervision.  I personally performed the history, physical, and medical decision making and all procedures in the evaluation of this patient.Milton Ferguson, MD 02/04/16 [phone]

## 2016-02-04 NOTE — ED Notes (Signed)
Pt c/o abdominal pain, n/v/d and states she is sob. Pt in nad in triage.

## 2016-02-05 NOTE — ED Notes (Signed)
Pt alert & oriented x4, stable gait. Patient given discharge instructions, paperwork & prescription(s). Patient  instructed to stop at the registration desk to finish any additional paperwork. Patient verbalized understanding. Pt left department w/ no further questions. 

## 2016-12-23 ENCOUNTER — Encounter (HOSPITAL_COMMUNITY): Payer: Self-pay | Admitting: Emergency Medicine

## 2016-12-23 ENCOUNTER — Emergency Department (HOSPITAL_COMMUNITY)
Admission: EM | Admit: 2016-12-23 | Discharge: 2016-12-23 | Disposition: A | Discharge status: Home / Self Care | Payer: Medicaid - Out of State | Attending: Emergency Medicine | Admitting: Emergency Medicine

## 2016-12-23 DIAGNOSIS — R112 Nausea with vomiting, unspecified: Secondary | ICD-10-CM

## 2016-12-23 DIAGNOSIS — N184 Chronic kidney disease, stage 4 (severe): Secondary | ICD-10-CM | POA: Insufficient documentation

## 2016-12-23 DIAGNOSIS — N289 Disorder of kidney and ureter, unspecified: Secondary | ICD-10-CM | POA: Insufficient documentation

## 2016-12-23 DIAGNOSIS — R791 Abnormal coagulation profile: Secondary | ICD-10-CM

## 2016-12-23 DIAGNOSIS — Z79899 Other long term (current) drug therapy: Secondary | ICD-10-CM | POA: Insufficient documentation

## 2016-12-23 DIAGNOSIS — I509 Heart failure, unspecified: Secondary | ICD-10-CM | POA: Insufficient documentation

## 2016-12-23 DIAGNOSIS — E1122 Type 2 diabetes mellitus with diabetic chronic kidney disease: Secondary | ICD-10-CM | POA: Insufficient documentation

## 2016-12-23 LAB — CBC WITH DIFFERENTIAL/PLATELET
Basophils Absolute: 0 10*3/uL (ref 0.0–0.1)
Basophils Relative: 0 %
Eosinophils Absolute: 0.1 10*3/uL (ref 0.0–0.7)
Eosinophils Relative: 1 %
HCT: 35.8 % — ABNORMAL LOW (ref 36.0–46.0)
Hemoglobin: 12.2 g/dL (ref 12.0–15.0)
Lymphocytes Relative: 14 %
Lymphs Abs: 1.1 10*3/uL (ref 0.7–4.0)
MCH: 31 pg (ref 26.0–34.0)
MCHC: 34.1 g/dL (ref 30.0–36.0)
MCV: 90.9 fL (ref 78.0–100.0)
Monocytes Absolute: 0.2 10*3/uL (ref 0.1–1.0)
Monocytes Relative: 3 %
Neutro Abs: 6.4 10*3/uL (ref 1.7–7.7)
Neutrophils Relative %: 82 %
Platelets: 307 10*3/uL (ref 150–400)
RBC: 3.94 MIL/uL (ref 3.87–5.11)
RDW: 13.6 % (ref 11.5–15.5)
WBC: 7.8 10*3/uL (ref 4.0–10.5)

## 2016-12-23 LAB — COMPREHENSIVE METABOLIC PANEL
ALT: 11 U/L — ABNORMAL LOW (ref 14–54)
AST: 15 U/L (ref 15–41)
Albumin: 3.1 g/dL — ABNORMAL LOW (ref 3.5–5.0)
Alkaline Phosphatase: 124 U/L (ref 38–126)
Anion gap: 10 (ref 5–15)
BUN: 34 mg/dL — ABNORMAL HIGH (ref 6–20)
CO2: 23 mmol/L (ref 22–32)
Calcium: 8.9 mg/dL (ref 8.9–10.3)
Chloride: 102 mmol/L (ref 101–111)
Creatinine, Ser: 2.13 mg/dL — ABNORMAL HIGH (ref 0.44–1.00)
GFR calc Af Amer: 35 mL/min — ABNORMAL LOW (ref >60)
GFR calc non Af Amer: 30 mL/min — ABNORMAL LOW (ref >60)
Glucose, Bld: 192 mg/dL — ABNORMAL HIGH (ref 65–99)
Potassium: 3.8 mmol/L (ref 3.5–5.1)
Sodium: 135 mmol/L (ref 135–145)
Total Bilirubin: 0.9 mg/dL (ref 0.3–1.2)
Total Protein: 7.2 g/dL (ref 6.5–8.1)

## 2016-12-23 LAB — PROTIME-INR
INR: 1.68
Prothrombin Time: 20 seconds — ABNORMAL HIGH (ref 11.4–15.2)

## 2016-12-23 LAB — URINALYSIS, ROUTINE W REFLEX MICROSCOPIC
Bilirubin Urine: NEGATIVE
Glucose, UA: 150 mg/dL — AB
Ketones, ur: NEGATIVE mg/dL
Nitrite: NEGATIVE
Protein, ur: >=300 mg/dL — AB
Specific Gravity, Urine: 1.017 (ref 1.005–1.030)
pH: 6 (ref 5.0–8.0)

## 2016-12-23 MED ORDER — ACETAMINOPHEN 325 MG PO TABS: 650.0000 mg | ORAL_TABLET | Freq: Once | ORAL | Status: DC

## 2016-12-23 MED ORDER — ONDANSETRON 4 MG PO TBDP
4.0000 mg | ORAL_TABLET | Freq: Once | ORAL | Status: AC
  Administered 2016-12-23: 4 mg via ORAL
  Filled 2016-12-23: qty 1

## 2016-12-23 MED ORDER — ONDANSETRON HCL 4 MG/2ML IJ SOLN: 4.0000 mg | Freq: Once | INTRAMUSCULAR | Status: DC

## 2016-12-23 MED ORDER — ACETAMINOPHEN 325 MG PO TABS
325.0000 mg | ORAL_TABLET | Freq: Once | ORAL | Status: AC
  Administered 2016-12-23: 325 mg via ORAL
  Filled 2016-12-23: qty 1

## 2016-12-23 NOTE — ED Notes (Signed)
Unable to obtain IV access despite multiple attempts. Dr Roxanne Mins notified.

## 2016-12-23 NOTE — ED Provider Notes (Signed)
[facility] DEPT Provider Note   CSN: [id number] Arrival date & time: 12/23/16  0053     History   Chief Complaint Chief Complaint  Patient presents with  . Emesis    HPI Diana [name] is a 31 y.o. female.  She comes in with a three-day history of nausea, vomiting, frequent bowel movements without overt diarrhea. There is associated aching in her arms and legs. She denies fever but has had some cold chills. She denies any sweats. She denies abdominal pain. She denies any sick contacts. She does have a left ventricular assist device for congestive heart failure.    Emesis      Past Medical History:  Diagnosis Date  . Acute kidney injury ([place])   . Acute systolic heart failure ([place])   . CHF (congestive heart failure) ([place])   . Diabetes mellitus without complication ([place])   . HCAP (healthcare-associated pneumonia)   . Obesity (BMI 30-39.9)    bmi 37.6  . Pleural effusion    bilateral  . Sinus tachycardia     Patient Active Problem List   Diagnosis Date Noted  . CKD stage 4 due to type 2 diabetes mellitus ([place]) 08/28/2014  . Constipation 08/25/2014  . Nausea & vomiting 08/23/2014  . Unspecified constipation 08/18/2014  . Trichomonas infection 08/12/2014  . Obesity, unspecified 08/11/2014  . Acute renal failure ([place]) 08/10/2014  . Sinus tachycardia 08/10/2014  . Nausea with vomiting 08/09/2014  . Acute systolic heart failure ([place]) 08/09/2014  . HCAP (healthcare-associated pneumonia) 08/08/2014  . Pleural effusion, bilateral 08/08/2014  . Pericardial effusion 08/08/2014  . DM type 2, controlled, with complication ([place]) 45/01/8881  . Anemia, unspecified 08/08/2014    Past Surgical History:  Procedure Laterality Date  . APPENDECTOMY    . ESOPHAGOGASTRODUODENOSCOPY N/A 08/16/2014   Procedure: ESOPHAGOGASTRODUODENOSCOPY (EGD);  Surgeon: Daneil Dolin, MD;  Location: [facility];  Service: Endoscopy;  Laterality: N/A;  . LEFT VENTRICULAR ASSIST DEVICE       OB History    No data available       Home Medications    Prior to Admission medications   Medication Sig Start Date End Date Taking? Authorizing Provider  acetaminophen (TYLENOL) 500 MG tablet Take 1,000 mg by mouth daily as needed for moderate pain.    Historical Provider, MD  carvedilol (COREG) 6.25 MG tablet Take 1 tablet (6.25 mg total) by mouth 2 (two) times daily with a meal. 08/19/14   Radene Gunning, NP  ondansetron (ZOFRAN ODT) 4 MG disintegrating tablet 4mg  ODT q4 hours prn nausea/vomit 02/04/16   Milton Ferguson, MD    Family History No family history on file.  Social History Social History  Substance Use Topics  . Smoking status: Never Smoker  . Smokeless tobacco: Never Used  . Alcohol use No     Allergies   Patient has no known allergies.   Review of Systems Review of Systems  Gastrointestinal: Positive for vomiting.  All other systems reviewed and are negative.    Physical Exam Updated Vital Signs BP (!) 135/106 (BP Location: Left Arm)   Pulse 75   Temp 98.4 F (36.9 C) (Oral)   Resp 16   SpO2 100%   Physical Exam  Nursing note and vitals reviewed.  31 year old female, resting comfortably and in no acute distress. Vital signs are Significant for hypertension (doubt listed blood pressure is accurate given her left ventricular assist device). Oxygen saturation is 100%, which is normal. Head is normocephalic  and atraumatic. PERRLA, EOMI. Oropharynx is clear. Neck is nontender and supple without adenopathy or JVD. Back is nontender and there is no CVA tenderness. Lungs are clear without rales, wheezes, or rhonchi. Chest is nontender. Heart: Steady hum of left ventricular assist device. Abdomen is soft, flat, nontender without masses or hepatosplenomegaly and peristalsis is hypoactive. Extremities have no cyanosis or edema, full range of motion is present. Skin is warm and dry without rash. Neurologic: Mental status is normal, cranial nerves are  intact, there are no motor or sensory deficits.  ED Treatments / Results  Labs (all labs ordered are listed, but only abnormal results are displayed) Labs Reviewed  COMPREHENSIVE METABOLIC PANEL - Abnormal; Notable for the following:       Result Value   Glucose, Bld 192 (*)    BUN 34 (*)    Creatinine, Ser 2.13 (*)    Albumin 3.1 (*)    ALT 11 (*)    GFR calc non Af Amer 30 (*)    GFR calc Af Amer 35 (*)    All other components within normal limits  CBC WITH DIFFERENTIAL/PLATELET - Abnormal; Notable for the following:    HCT 35.8 (*)    All other components within normal limits  URINALYSIS, ROUTINE W REFLEX MICROSCOPIC - Abnormal; Notable for the following:    APPearance HAZY (*)    Glucose, UA 150 (*)    Hgb urine dipstick MODERATE (*)    Protein, ur >=300 (*)    Leukocytes, UA TRACE (*)    Bacteria, UA RARE (*)    All other components within normal limits  PROTIME-INR - Abnormal; Notable for the following:    Prothrombin Time 20.0 (*)    All other components within normal limits    Procedures Procedures (including critical care time)  Medications Ordered in ED Medications  ondansetron (ZOFRAN) injection 4 mg (not administered)  acetaminophen (TYLENOL) tablet 650 mg (not administered)  acetaminophen (TYLENOL) tablet 325 mg (not administered)  ondansetron (ZOFRAN-ODT) disintegrating tablet 4 mg (4 mg Oral Given 12/23/16 0221)     Initial Impression / Assessment and Plan / ED Course  I have reviewed the triage vital signs and the nursing notes.  Pertinent labs & imaging results that were available during my care of the patient were reviewed by me and considered in my medical decision making (see chart for details).  Nausea, vomiting, frequent bowel movements, myalgias consistent with viral gastroenteritis. No red flags to suggest more serious illness. Patient appears adequately hydrated on exam. Will avoid IV fluids unless labs show significant dehydration. She will be  given IV ondansetron. She's requesting acetaminophen for pain. Old records are reviewed confirming congestive heart failure managed with a left ventricular assist device-[facility] at [facility].  IV was unable to be started. She was given ODT ondansetron with good relief of nausea and was able to tolerate oral fluids. Laboratory workup shows renal insufficiency which is unchanged from baseline. INR is subtherapeutic at 1.68. Patient is advised of this result and is advised to contact the clinic which monitors her warfarin to make appropriate dose adjustments.  Final Clinical Impressions(s) / ED Diagnoses   Final diagnoses:  Non-intractable vomiting with nausea, unspecified vomiting type  Renal insufficiency  Subtherapeutic international normalized ratio (INR)    New Prescriptions New Prescriptions   No medications on file     Delora Fuel, MD 16/10/96 0454

## 2016-12-23 NOTE — ED Triage Notes (Signed)
Pt with vomiting for 3 days, headache, and body aches.

## 2016-12-23 NOTE — ED Notes (Signed)
Pt given Joyous Gleghorn-Ale to drink

## 2016-12-23 NOTE — Discharge Instructions (Signed)
Use your ondansetron (Zofran) as needed.  Your INR (blood test for warfarin) was 1.68. Call the clinic which adjusts your warfarin dose to see how they want you to change it.

## 2020-11-19 ENCOUNTER — Other Ambulatory Visit: Payer: Self-pay

## 2020-11-19 ENCOUNTER — Encounter (HOSPITAL_COMMUNITY): Payer: Self-pay | Admitting: *Deleted

## 2020-11-19 ENCOUNTER — Emergency Department (HOSPITAL_COMMUNITY): Payer: 59

## 2020-11-19 ENCOUNTER — Observation Stay (HOSPITAL_COMMUNITY)
Admission: EM | Admit: 2020-11-19 | Discharge: 2020-11-20 | Disposition: A | Discharge status: Home / Self Care | Payer: 59 | Attending: Internal Medicine | Admitting: Internal Medicine

## 2020-11-19 DIAGNOSIS — R531 Weakness: Secondary | ICD-10-CM | POA: Diagnosis present

## 2020-11-19 DIAGNOSIS — J1282 Pneumonia due to coronavirus disease 2019: Secondary | ICD-10-CM | POA: Insufficient documentation

## 2020-11-19 DIAGNOSIS — N189 Chronic kidney disease, unspecified: Secondary | ICD-10-CM

## 2020-11-19 DIAGNOSIS — Z940 Kidney transplant status: Secondary | ICD-10-CM | POA: Insufficient documentation

## 2020-11-19 DIAGNOSIS — I5021 Acute systolic (congestive) heart failure: Secondary | ICD-10-CM | POA: Insufficient documentation

## 2020-11-19 DIAGNOSIS — N184 Chronic kidney disease, stage 4 (severe): Secondary | ICD-10-CM | POA: Insufficient documentation

## 2020-11-19 DIAGNOSIS — R112 Nausea with vomiting, unspecified: Secondary | ICD-10-CM | POA: Insufficient documentation

## 2020-11-19 DIAGNOSIS — U071 COVID-19: Secondary | ICD-10-CM | POA: Diagnosis not present

## 2020-11-19 DIAGNOSIS — Z79899 Other long term (current) drug therapy: Secondary | ICD-10-CM | POA: Diagnosis not present

## 2020-11-19 DIAGNOSIS — N179 Acute kidney failure, unspecified: Secondary | ICD-10-CM | POA: Insufficient documentation

## 2020-11-19 DIAGNOSIS — Z941 Heart transplant status: Secondary | ICD-10-CM | POA: Insufficient documentation

## 2020-11-19 DIAGNOSIS — R109 Unspecified abdominal pain: Secondary | ICD-10-CM | POA: Diagnosis not present

## 2020-11-19 DIAGNOSIS — E1122 Type 2 diabetes mellitus with diabetic chronic kidney disease: Secondary | ICD-10-CM | POA: Insufficient documentation

## 2020-11-19 DIAGNOSIS — E669 Obesity, unspecified: Secondary | ICD-10-CM | POA: Diagnosis present

## 2020-11-19 DIAGNOSIS — R0602 Shortness of breath: Secondary | ICD-10-CM

## 2020-11-19 LAB — RESP PANEL BY RT-PCR (FLU A&B, COVID) ARPGX2
Influenza A by PCR: NEGATIVE
Influenza B by PCR: NEGATIVE
SARS Coronavirus 2 by RT PCR: POSITIVE — AB

## 2020-11-19 LAB — CBG MONITORING, ED: Glucose-Capillary: 186 mg/dL — ABNORMAL HIGH (ref 70–99)

## 2020-11-19 NOTE — ED Provider Notes (Signed)
[facility] EMERGENCY DEPARTMENT Provider Note   CSN: [id number] Arrival date & time: 11/19/20  2140     History Chief Complaint  Patient presents with  . Generalized Body Aches    Diana [name] is a 35 y.o. female.  The history is provided by the patient.  Weakness Severity:  Severe Onset quality:  Gradual Duration:  4 days Timing:  Constant Progression:  Worsening Chronicity:  New Relieved by:  Nothing Worsened by:  Nothing Associated symptoms: abdominal pain, cough, myalgias, shortness of breath and vomiting   Associated symptoms: no fever   Patient with history of CHF, diabetes, history of heart and kidney transplant presents with fatigue, myalgias, cough and vomiting.  She reports it has been ongoing for at least 4 days.  No fever. No active chest pain at this time   She reports decreased p.o. intake and was unable to take her antirejection meds today  Patient reports she is fully vaccinated and had Covid booster Past Medical History:  Diagnosis Date  . Acute kidney injury ([place])   . Acute systolic heart failure ([place])   . CHF (congestive heart failure) ([place])   . Diabetes mellitus without complication ([place])   . HCAP (healthcare-associated pneumonia)   . Obesity (BMI 30-39.9)    bmi 37.6  . Pleural effusion    bilateral  . Sinus tachycardia     Patient Active Problem List   Diagnosis Date Noted  . CKD stage 4 due to type 2 diabetes mellitus ([place]) 08/28/2014  . Constipation 08/25/2014  . Nausea & vomiting 08/23/2014  . Unspecified constipation 08/18/2014  . Trichomonas infection 08/12/2014  . Obesity, unspecified 08/11/2014  . Acute renal failure ([place]) 08/10/2014  . Sinus tachycardia 08/10/2014  . Nausea with vomiting 08/09/2014  . Acute systolic heart failure ([place]) 08/09/2014  . HCAP (healthcare-associated pneumonia) 08/08/2014  . Pleural effusion, bilateral 08/08/2014  . Pericardial effusion 08/08/2014  . DM type 2, controlled, with complication  ([place]) 46/96/2952  . Anemia, unspecified 08/08/2014    Past Surgical History:  Procedure Laterality Date  . APPENDECTOMY    . ESOPHAGOGASTRODUODENOSCOPY N/A 08/16/2014   Procedure: ESOPHAGOGASTRODUODENOSCOPY (EGD);  Surgeon: Daneil Dolin, MD;  Location: [facility];  Service: Endoscopy;  Laterality: N/A;  . LEFT VENTRICULAR ASSIST DEVICE       OB History   No obstetric history on file.     History reviewed. No pertinent family history.  Social History   Tobacco Use  . Smoking status: Never Smoker  . Smokeless tobacco: Never Used  Substance Use Topics  . Alcohol use: No  . Drug use: No    Home Medications Prior to Admission medications   Medication Sig Start Date End Date Taking? Authorizing Provider  acetaminophen (TYLENOL) 500 MG tablet Take 1,000 mg by mouth daily as needed for moderate pain.    [provider]  carvedilol (COREG) 6.25 MG tablet Take 1 tablet (6.25 mg total) by mouth 2 (two) times daily with a meal. 08/19/14   Black, Lezlie Octave, NP  ondansetron (ZOFRAN ODT) 4 MG disintegrating tablet 4mg  ODT q4 hours prn nausea/vomit 02/04/16   Milton Ferguson, MD    Allergies    Patient has no known allergies.  Review of Systems   Review of Systems  Constitutional: Positive for fatigue. Negative for fever.  Respiratory: Positive for cough and shortness of breath.   Gastrointestinal: Positive for abdominal pain and vomiting.  Musculoskeletal: Positive for myalgias.  Neurological: Positive for weakness.  All other  systems reviewed and are negative.   Physical Exam Updated Vital Signs BP (!) 92/57 (BP Location: Right Arm)   Pulse 93   Temp 98.5 F (36.9 C) (Oral)   Resp 16   Ht 1.575 m (5\' 2" )   Wt 89.8 kg   SpO2 98%   BMI 36.21 kg/m   Physical Exam CONSTITUTIONAL: Chronically ill-appearing HEAD: Normocephalic/atraumatic EYES: EOMI/PERRL ENMT: Mucous membranes moist NECK: supple no meningeal signs SPINE/BACK:entire spine nontender CV: S1/S2  noted LUNGS: Lungs are clear to auscultation bilaterally, no apparent distress ABDOMEN: soft, nontender, no rebound or guarding, bowel sounds noted throughout abdomen GU:no cva tenderness NEURO: Pt is awake/alert/appropriate, moves all extremitiesx4.  No facial droop.  No arm or leg drift EXTREMITIES: pulses normal/equal, full ROM SKIN: warm, color normal PSYCH: no abnormalities of mood noted, alert and oriented to situation  ED Results / Procedures / Treatments   Labs (all labs ordered are listed, but only abnormal results are displayed) Labs Reviewed  RESP PANEL BY RT-PCR (FLU A&B, COVID) ARPGX2 - Abnormal; Notable for the following components:      Result Value   SARS Coronavirus 2 by RT PCR POSITIVE (*)    All other components within normal limits  D-DIMER, QUANTITATIVE (NOT AT [facility]) - Abnormal; Notable for the following components:   D-Dimer, Quant 1.23 (*)    All other components within normal limits  LACTATE DEHYDROGENASE - Abnormal; Notable for the following components:   LDH 207 (*)    All other components within normal limits  TRIGLYCERIDES - Abnormal; Notable for the following components:   Triglycerides 196 (*)    All other components within normal limits  FIBRINOGEN - Abnormal; Notable for the following components:   Fibrinogen 718 (*)    All other components within normal limits  C-REACTIVE PROTEIN - Abnormal; Notable for the following components:   CRP 2.5 (*)    All other components within normal limits  COMPREHENSIVE METABOLIC PANEL - Abnormal; Notable for the following components:   Glucose, Bld 113 (*)    BUN 28 (*)    Creatinine, Ser 1.84 (*)    GFR, Estimated 37 (*)    All other components within normal limits  CBG MONITORING, ED - Abnormal; Notable for the following components:   Glucose-Capillary 186 (*)    All other components within normal limits  CBC WITH DIFFERENTIAL/PLATELET  LACTIC ACID, PLASMA  PROCALCITONIN  FERRITIN  PROTIME-INR  HCG,  QUANTITATIVE, PREGNANCY    EKG EKG Interpretation  Date/Time:  Saturday November 19 2020 22:25:59 EST Ventricular Rate:  97 PR Interval:  116 QRS Duration: 74 QT Interval:  320 QTC Calculation: 406 R Axis:   113 Text Interpretation: Normal sinus rhythm Right axis deviation Cannot rule out Anterior infarct , age undetermined Abnormal ECG Confirmed by Ripley Fraise [phone]) on 11/19/2020 11:29:08 PM   Radiology DG Chest Port 1 View  Result Date: 11/20/2020 CLINICAL DATA:  COVID-19.  Cough. EXAM: PORTABLE CHEST 1 VIEW COMPARISON:  08/08/2014 FINDINGS: Mild cardiomegaly. Hazy opacity at the left lung base may be partially due to overlying soft tissue. Otherwise, the lungs are clear. No pleural effusion or pneumothorax. Remote median sternotomy. IMPRESSION: Hazy opacity at the left lung base may be partially due to overlying soft tissue, though developing consolidation would be difficult to exclude. PA and lateral radiographs may be helpful for further characterization. Electronically Signed   By: Ulyses Jarred M.D.   On: 11/20/2020 00:11    Procedures .Critical Care Performed by:  Ripley Fraise, MD Authorized by: Ripley Fraise, MD   Critical care provider statement:    Critical care time (minutes):  60   Critical care start time:  11/20/2020 3:00 AM   Critical care end time:  11/20/2020 4:00 AM   Critical care time was exclusive of:  Separately billable procedures and treating other patients   Critical care was necessary to treat or prevent imminent or life-threatening deterioration of the following conditions:  Respiratory failure and renal failure   Critical care was time spent personally by me on the following activities:  Discussions with consultants, examination of patient, evaluation of patient's response to treatment, pulse oximetry, re-evaluation of patient's condition, review of old charts, ordering and review of radiographic studies, ordering and review of laboratory studies and  ordering and performing treatments and interventions   I assumed direction of critical care for this patient from another provider in my specialty: no     Care discussed with: admitting provider        Medications Ordered in ED Medications  acetaminophen (TYLENOL) tablet 650 mg (650 mg Oral Given 11/20/20 0157)  fentaNYL (SUBLIMAZE) injection 50 mcg (50 mcg Intravenous Given 11/20/20 0156)  sodium chloride 0.9 % bolus 1,000 mL (1,000 mLs Intravenous New Bag/Given 11/20/20 0254)    ED Course  I have reviewed the triage vital signs and the nursing notes.  Pertinent labs & imaging results that were available during my care of the patient were reviewed by me and considered in my medical decision making (see chart for details).    MDM Rules/Calculators/A&P                           11:47 PM Patient with multiple chronic illnesses including previous kidney and cardiac transplant presenting with fatigue/cough.  Patient is positive for COVID-19.  She had recent evaluation by her cardiologist and has been stable from a cardiac standpoint. Patient is ill-appearing.  Chest x-ray and labs are pending at this time 1:43 AM X-ray reveals pneumonia, but she is not hypoxic.  Blood pressure is around her baseline Patient is a very difficult IV stick.  Multiple nurses attempted IV placement, and finally was placed in her left hand However they were unable to obtain blood work. I was able to do a femoral vein stick to obtain labs.  This was done under ultrasound under sterile procedure, patient tolerated well.  Pressure was held to the site after the procedure Labs pending at this time 4:35 AM Labs reveal acute on chronic renal insufficiency. Baseline labs from about a month ago with a normal creatinine per care everywhere Patient stood up and felt weak and had a pulse ox of 91%. Patient is extremely high risk due to history of kidney and heart transplant. IV fluids were ordered. Remdesivir has been  ordered. Discussed with Dr. Josephine Cables for admission  Diana [name] was evaluated in Emergency Department on 11/19/2020 for the symptoms described in the history of present illness. She was evaluated in the context of the global COVID-19 pandemic, which necessitated consideration that the patient might be at risk for infection with the SARS-CoV-2 virus that causes COVID-19. Institutional protocols and algorithms that pertain to the evaluation of patients at risk for COVID-19 are in a state of rapid change based on information released by regulatory bodies including the CDC and federal and state organizations. These policies and algorithms were followed during the patient's care in the ED.  Final Clinical Impression(s) /  ED Diagnoses Final diagnoses:  AKI (acute kidney injury) ([place])  Pneumonia due to COVID-19 virus    Rx / DC Orders ED Discharge Orders    None       Ripley Fraise, MD 11/20/20 [phone]

## 2020-11-19 NOTE — ED Notes (Signed)
Date and time results received: 11/19/20 2240  Test: COVID Critical Value: positive  Name of Provider Notified: Sedonia Small, MD  Orders Received? Or Actions Taken?: acknowledge

## 2020-11-19 NOTE — ED Triage Notes (Signed)
Pt body aches and decrease in appetite since a week.  Denies any fevers. + nausea

## 2020-11-20 DIAGNOSIS — U071 COVID-19: Secondary | ICD-10-CM | POA: Diagnosis not present

## 2020-11-20 DIAGNOSIS — N189 Chronic kidney disease, unspecified: Secondary | ICD-10-CM

## 2020-11-20 DIAGNOSIS — Z941 Heart transplant status: Secondary | ICD-10-CM | POA: Diagnosis not present

## 2020-11-20 DIAGNOSIS — Z940 Kidney transplant status: Secondary | ICD-10-CM

## 2020-11-20 DIAGNOSIS — N1832 Chronic kidney disease, stage 3b: Secondary | ICD-10-CM | POA: Diagnosis not present

## 2020-11-20 DIAGNOSIS — R0602 Shortness of breath: Secondary | ICD-10-CM

## 2020-11-20 DIAGNOSIS — R112 Nausea with vomiting, unspecified: Secondary | ICD-10-CM

## 2020-11-20 DIAGNOSIS — J9601 Acute respiratory failure with hypoxia: Secondary | ICD-10-CM | POA: Insufficient documentation

## 2020-11-20 LAB — CBC WITH DIFFERENTIAL/PLATELET
Abs Immature Granulocytes: 0.07 10*3/uL (ref 0.00–0.07)
Basophils Absolute: 0 10*3/uL (ref 0.0–0.1)
Basophils Relative: 0 %
Eosinophils Absolute: 0 10*3/uL (ref 0.0–0.5)
Eosinophils Relative: 0 %
HCT: 36.3 % (ref 36.0–46.0)
Hemoglobin: 12.4 g/dL (ref 12.0–15.0)
Immature Granulocytes: 1 %
Lymphocytes Relative: 22 %
Lymphs Abs: 1.7 10*3/uL (ref 0.7–4.0)
MCH: 29.7 pg (ref 26.0–34.0)
MCHC: 34.2 g/dL (ref 30.0–36.0)
MCV: 87.1 fL (ref 80.0–100.0)
Monocytes Absolute: 0.6 10*3/uL (ref 0.1–1.0)
Monocytes Relative: 8 %
Neutro Abs: 5.4 10*3/uL (ref 1.7–7.7)
Neutrophils Relative %: 69 %
Platelets: 268 10*3/uL (ref 150–400)
RBC: 4.17 MIL/uL (ref 3.87–5.11)
RDW: 11.8 % (ref 11.5–15.5)
WBC: 7.9 10*3/uL (ref 4.0–10.5)
nRBC: 0 % (ref 0.0–0.2)

## 2020-11-20 LAB — PROCALCITONIN: Procalcitonin: 0.1 ng/mL

## 2020-11-20 LAB — COMPREHENSIVE METABOLIC PANEL
ALT: 13 U/L (ref 0–44)
AST: 23 U/L (ref 15–41)
Albumin: 3.8 g/dL (ref 3.5–5.0)
Alkaline Phosphatase: 114 U/L (ref 38–126)
Anion gap: 12 (ref 5–15)
BUN: 28 mg/dL — ABNORMAL HIGH (ref 6–20)
CO2: 23 mmol/L (ref 22–32)
Calcium: 9 mg/dL (ref 8.9–10.3)
Chloride: 102 mmol/L (ref 98–111)
Creatinine, Ser: 1.84 mg/dL — ABNORMAL HIGH (ref 0.44–1.00)
GFR, Estimated: 37 mL/min — ABNORMAL LOW (ref >60)
Glucose, Bld: 113 mg/dL — ABNORMAL HIGH (ref 70–99)
Potassium: 3.9 mmol/L (ref 3.5–5.1)
Sodium: 137 mmol/L (ref 135–145)
Total Bilirubin: 0.6 mg/dL (ref 0.3–1.2)
Total Protein: 8 g/dL (ref 6.5–8.1)

## 2020-11-20 LAB — PROTIME-INR
INR: 1.1 (ref 0.8–1.2)
Prothrombin Time: 13.6 seconds (ref 11.4–15.2)

## 2020-11-20 LAB — HIV ANTIBODY (ROUTINE TESTING W REFLEX): HIV Screen 4th Generation wRfx: NONREACTIVE

## 2020-11-20 LAB — CBG MONITORING, ED: Glucose-Capillary: 240 mg/dL — ABNORMAL HIGH (ref 70–99)

## 2020-11-20 LAB — CBC
HCT: 34.8 % — ABNORMAL LOW (ref 36.0–46.0)
Hemoglobin: 11.4 g/dL — ABNORMAL LOW (ref 12.0–15.0)
MCH: 28.9 pg (ref 26.0–34.0)
MCHC: 32.8 g/dL (ref 30.0–36.0)
MCV: 88.3 fL (ref 80.0–100.0)
Platelets: 230 10*3/uL (ref 150–400)
RBC: 3.94 MIL/uL (ref 3.87–5.11)
RDW: 11.7 % (ref 11.5–15.5)
WBC: 6.6 10*3/uL (ref 4.0–10.5)
nRBC: 0 % (ref 0.0–0.2)

## 2020-11-20 LAB — LACTIC ACID, PLASMA: Lactic Acid, Venous: 1.3 mmol/L (ref 0.5–1.9)

## 2020-11-20 LAB — C-REACTIVE PROTEIN: CRP: 2.5 mg/dL — ABNORMAL HIGH (ref <1.0)

## 2020-11-20 LAB — CREATININE, SERUM
Creatinine, Ser: 1.85 mg/dL — ABNORMAL HIGH (ref 0.44–1.00)
GFR, Estimated: 37 mL/min — ABNORMAL LOW (ref >60)

## 2020-11-20 LAB — HCG, QUANTITATIVE, PREGNANCY: hCG, Beta Chain, Quant, S: <1 m[IU]/mL (ref <5)

## 2020-11-20 LAB — FERRITIN: Ferritin: 271 ng/mL (ref 11–307)

## 2020-11-20 LAB — FIBRINOGEN: Fibrinogen: 718 mg/dL — ABNORMAL HIGH (ref 210–475)

## 2020-11-20 LAB — D-DIMER, QUANTITATIVE: D-Dimer, Quant: 1.23 ug/mL-FEU — ABNORMAL HIGH (ref 0.00–0.50)

## 2020-11-20 LAB — TRIGLYCERIDES: Triglycerides: 196 mg/dL — ABNORMAL HIGH (ref <150)

## 2020-11-20 LAB — LACTATE DEHYDROGENASE: LDH: 207 U/L — ABNORMAL HIGH (ref 98–192)

## 2020-11-20 MED ORDER — ENOXAPARIN SODIUM 40 MG/0.4ML ~~LOC~~ SOLN
40.0000 mg | SUBCUTANEOUS | Status: DC
  Administered 2020-11-20: 40 mg via SUBCUTANEOUS
  Filled 2020-11-20: qty 0.4

## 2020-11-20 MED ORDER — INSULIN ASPART 100 UNIT/ML ~~LOC~~ SOLN
0.0000 [IU] | Freq: Three times a day (TID) | SUBCUTANEOUS | Status: DC
  Administered 2020-11-20: 3 [IU] via SUBCUTANEOUS
  Filled 2020-11-20: qty 1

## 2020-11-20 MED ORDER — INSULIN ASPART 100 UNIT/ML ~~LOC~~ SOLN: 0.0000 [IU] | Freq: Every day | SUBCUTANEOUS | Status: DC

## 2020-11-20 MED ORDER — ACETAMINOPHEN 325 MG PO TABS: 650.0000 mg | ORAL_TABLET | Freq: Four times a day (QID) | ORAL | Status: DC | PRN

## 2020-11-20 MED ORDER — SODIUM CHLORIDE 0.9 % IV BOLUS (SEPSIS)
1000.0000 mL | Freq: Once | INTRAVENOUS | Status: AC
  Administered 2020-11-20: 1000 mL via INTRAVENOUS

## 2020-11-20 MED ORDER — ACETAMINOPHEN 325 MG PO TABS
650.0000 mg | ORAL_TABLET | Freq: Once | ORAL | Status: AC
  Administered 2020-11-20: 650 mg via ORAL
  Filled 2020-11-20: qty 2

## 2020-11-20 MED ORDER — PANTOPRAZOLE SODIUM 40 MG PO TBEC: 40.0000 mg | DELAYED_RELEASE_TABLET | Freq: Every day | ORAL | 0 refills | Status: AC

## 2020-11-20 MED ORDER — GUAIFENESIN-DM 100-10 MG/5ML PO SYRP: 10.0000 mL | ORAL_SOLUTION | ORAL | Status: DC | PRN

## 2020-11-20 MED ORDER — ZINC SULFATE 220 (50 ZN) MG PO CAPS
220.0000 mg | ORAL_CAPSULE | Freq: Every day | ORAL | Status: DC
  Administered 2020-11-20: 220 mg via ORAL
  Filled 2020-11-20: qty 1

## 2020-11-20 MED ORDER — ALBUTEROL SULFATE HFA 108 (90 BASE) MCG/ACT IN AERS: 2.0000 | INHALATION_SPRAY | Freq: Four times a day (QID) | RESPIRATORY_TRACT | 1 refills | Status: AC | PRN

## 2020-11-20 MED ORDER — ASCORBIC ACID 500 MG PO TABS: 500.0000 mg | ORAL_TABLET | Freq: Every day | ORAL | 0 refills | Status: AC

## 2020-11-20 MED ORDER — DEXAMETHASONE 6 MG PO TABS: 6.0000 mg | ORAL_TABLET | Freq: Every day | ORAL | 0 refills | Status: AC

## 2020-11-20 MED ORDER — ZINC SULFATE 220 (50 ZN) MG PO CAPS: 220.0000 mg | ORAL_CAPSULE | Freq: Every day | ORAL | 0 refills | Status: AC

## 2020-11-20 MED ORDER — GUAIFENESIN-DM 100-10 MG/5ML PO SYRP: 10.0000 mL | ORAL_SOLUTION | ORAL | 0 refills | Status: AC | PRN

## 2020-11-20 MED ORDER — ONDANSETRON 4 MG PO TBDP: ORAL_TABLET | ORAL | 0 refills | Status: AC

## 2020-11-20 MED ORDER — FENTANYL CITRATE (PF) 100 MCG/2ML IJ SOLN
50.0000 ug | Freq: Once | INTRAMUSCULAR | Status: AC
Start: 2020-11-20 — End: 2020-11-20
  Administered 2020-11-20: 50 ug via INTRAVENOUS
  Filled 2020-11-20: qty 2

## 2020-11-20 MED ORDER — LINAGLIPTIN 5 MG PO TABS
5.0000 mg | ORAL_TABLET | Freq: Every day | ORAL | Status: DC
  Filled 2020-11-20 (×2): qty 1

## 2020-11-20 MED ORDER — DEXAMETHASONE 4 MG PO TABS
6.0000 mg | ORAL_TABLET | ORAL | Status: DC
  Administered 2020-11-20: 6 mg via ORAL
  Filled 2020-11-20: qty 2

## 2020-11-20 MED ORDER — ASCORBIC ACID 500 MG PO TABS
500.0000 mg | ORAL_TABLET | Freq: Every day | ORAL | Status: DC
  Administered 2020-11-20: 500 mg via ORAL
  Filled 2020-11-20: qty 1

## 2020-11-20 MED ORDER — PANTOPRAZOLE SODIUM 40 MG IV SOLR: 40.0000 mg | Freq: Every day | INTRAVENOUS | Status: DC

## 2020-11-20 MED ORDER — ONDANSETRON HCL 4 MG/2ML IJ SOLN: 4.0000 mg | Freq: Four times a day (QID) | INTRAMUSCULAR | Status: DC | PRN

## 2020-11-20 MED ORDER — ALBUTEROL SULFATE HFA 108 (90 BASE) MCG/ACT IN AERS
2.0000 | INHALATION_SPRAY | Freq: Four times a day (QID) | RESPIRATORY_TRACT | Status: DC
  Administered 2020-11-20: 2 via RESPIRATORY_TRACT
  Filled 2020-11-20: qty 6.7

## 2020-11-20 NOTE — ED Notes (Signed)
Attempted to ambulate pt in room. Pt stood, O2 dropped to 91% and pt stated she "wasn't ready yet" - referring to ambulating. Pt helped back into bed.

## 2020-11-20 NOTE — H&P (Signed)
History and Physical  Diana [name] EPP:[id number] DOB: October 30, 1986 DOA: 11/19/2020  Referring physician: Ripley Fraise MD PCP: Patient, No Pcp Per  Patient coming from: Home  Chief Complaint: Generalized body aches  HPI: Diana [name] is a 35 y.o. female with medical history significant for systolic CHF s/p Heart and right kidney transplant who presents to the emergency department due to about 4-day onset of generalized body aches, fatigue, myalgia, cough, nausea and one episode of nonbloody vomiting.  Patient endorsed diarrhea 2 days ago which lasted for about 24 hours with about 6-7 watery stools.  Last BM was yesterday in the morning (11/19/2020).  She complained of decreased appetite, but denies loss of taste or smell.  Patient states that she has had 2 vaccines and a booster.  She lives alone and denies having any sick contact.  ED Course:  In the emergency department, she was intermittently tachypneic.  BP on arrival was 92/57.  O2 sat dropped to 91% when patient stood up from the bed.  Work-up in the ED showed normal CBC, BUN to creatinine 28/1.84 (no recent labs for comparison, most recent lab was creatinine of 2.1-2.3 about 3 to 4 years ago), D-dimer 1.23, SARS coronavirus 2 was positive.  X-ray showed hazy opacity at the left lung base which may be partially due to overlying soft tissue, though developing consolidation will be difficult to exclude.  She was treated with Tylenol and fentanyl.  Pharmacy was consulted for remdesivir by ED physician.  Hospitalist was asked to admit patient for further evaluation and management.  Review of Systems: Constitutional: Positive for fatigue.  Negative for chills and fever.  HENT: Negative for ear pain and sore throat.   Eyes: Negative for pain and visual disturbance.  Respiratory: Positive for cough and shortness of breath on ambulation.   Cardiovascular: Negative for chest pain and palpitations.  Gastrointestinal: Positive for nausea and  vomiting.   Endocrine: Negative for polyphagia and polyuria.  Genitourinary: Negative for decreased urine volume, dysuria, enuresis Musculoskeletal: Positive for muscle aches.   Skin: Negative for color change and rash.  Allergic/Immunologic: Negative for immunocompromised state.  Neurological: Positive for weakness.  Negative for tremors, syncope, speech difficulty Hematological: Does not bruise/bleed easily.  All other systems reviewed and are negative   Past Medical History:  Diagnosis Date  . Acute kidney injury ([place])   . Acute systolic heart failure ([place])   . CHF (congestive heart failure) ([place])   . Diabetes mellitus without complication ([place])   . HCAP (healthcare-associated pneumonia)   . Obesity (BMI 30-39.9)    bmi 37.6  . Pleural effusion    bilateral  . Sinus tachycardia    Past Surgical History:  Procedure Laterality Date  . APPENDECTOMY    . ESOPHAGOGASTRODUODENOSCOPY N/A 08/16/2014   Procedure: ESOPHAGOGASTRODUODENOSCOPY (EGD);  Surgeon: Daneil Dolin, MD;  Location: [facility];  Service: Endoscopy;  Laterality: N/A;  . LEFT VENTRICULAR ASSIST DEVICE      Social History:  reports that she has never smoked. She has never used smokeless tobacco. She reports that she does not drink alcohol and does not use drugs.   No Known Allergies  History reviewed. No pertinent family history.   Prior to Admission medications   Medication Sig Start Date End Date Taking? Authorizing Provider  acetaminophen (TYLENOL) 500 MG tablet Take 1,000 mg by mouth daily as needed for moderate pain.    [provider]  carvedilol (COREG) 6.25 MG tablet Take 1 tablet (6.25 mg total)  by mouth 2 (two) times daily with a meal. 08/19/14   Black, Lezlie Octave, NP  ondansetron (ZOFRAN ODT) 4 MG disintegrating tablet 4mg  ODT q4 hours prn nausea/vomit 02/04/16   Milton Ferguson, MD    Physical Exam: BP 108/74 (BP Location: Right Arm)   Pulse 82   Temp 98.2 F (36.8 C) (Oral)   Resp (!)  23   Ht 5\' 2"  (1.575 m)   Wt 89.8 kg   SpO2 100%   BMI 36.21 kg/m   . General: 35 y.o. year-old female well developed well nourished in no acute distress.  Alert and oriented x3. Marland Kitchen HEENT: NCAT, EOMI . Neck: Supple, trachea medial . Cardiovascular: Regular rate and rhythm with no rubs or gallops.  No thyromegaly or JVD noted.  No lower extremity edema. 2/4 pulses in all 4 extremities. Marland Kitchen Respiratory: Clear to auscultation with no wheezes or rales. Good inspiratory effort. . Abdomen: Soft nontender nondistended with normal bowel sounds x4 quadrants. . Muskuloskeletal: No cyanosis, clubbing or edema noted bilaterally . Neuro: CN II-XII intact, strength, sensation, reflexes . Skin: No ulcerative lesions noted or rashes . Psychiatry: Judgement and insight appear normal. Mood is appropriate for condition and setting          Labs on Admission:  Basic Metabolic Panel: Recent Labs  Lab 11/19/20 0141  NA 137  K 3.9  CL 102  CO2 23  GLUCOSE 113*  BUN 28*  CREATININE 1.84*  CALCIUM 9.0   Liver Function Tests: Recent Labs  Lab 11/19/20 0141  AST 23  ALT 13  ALKPHOS 114  BILITOT 0.6  PROT 8.0  ALBUMIN 3.8   No results for input(s): LIPASE, AMYLASE in the last 168 hours. No results for input(s): AMMONIA in the last 168 hours. CBC: Recent Labs  Lab 11/19/20 0141  WBC 7.9  NEUTROABS 5.4  HGB 12.4  HCT 36.3  MCV 87.1  PLT 268   Cardiac Enzymes: No results for input(s): CKTOTAL, CKMB, CKMBINDEX, TROPONINI in the last 168 hours.  BNP (last 3 results) No results for input(s): BNP in the last 8760 hours.  ProBNP (last 3 results) No results for input(s): PROBNP in the last 8760 hours.  CBG: Recent Labs  Lab 11/19/20 2227  GLUCAP 186*    Radiological Exams on Admission: DG Chest Port 1 View  Result Date: 11/20/2020 CLINICAL DATA:  COVID-19.  Cough. EXAM: PORTABLE CHEST 1 VIEW COMPARISON:  08/08/2014 FINDINGS: Mild cardiomegaly. Hazy opacity at the left lung base  may be partially due to overlying soft tissue. Otherwise, the lungs are clear. No pleural effusion or pneumothorax. Remote median sternotomy. IMPRESSION: Hazy opacity at the left lung base may be partially due to overlying soft tissue, though developing consolidation would be difficult to exclude. PA and lateral radiographs may be helpful for further characterization. Electronically Signed   By: Ulyses Jarred M.D.   On: 11/20/2020 00:11    EKG: I independently viewed the EKG done and my findings are as followed: Normal sinus rhythm at a rate of 97 bpm  Assessment/Plan Present on Admission: . COVID-19 virus infection . Nausea & vomiting . Obesity, unspecified  Principal Problem:   COVID-19 virus infection Active Problems:   Obesity, unspecified   Nausea & vomiting   Shortness of breath   Chronic kidney disease   History of heart transplant ([place])   History of kidney transplant   Shortness of breath secondary to COVID-19 virus infection SARS coronavirus 2 was positive O2 sat was normal  at rest, however this dropped to 91% on getting up from bed Continue albuterol q.6h Continue IV Decadron 6 mg daily Continue IV Remdesivir per pharmacy protocol Continue vitamin-C 500 mg p.o. Daily Continue zinc 220 mg p.o. Daily Continue Mucinex, Robitussin and Tussionex Continue Tylenol p.r.n. for fever Continue supplemental oxygen to maintain O2 sat > or = 94% with plan to wean patient off supplemental oxygen as tolerated (of note, patient does not use oxygen at baseline) Continue incentive spirometry and flutter valve q29min as tolerated Encourage proning, early ambulation, and side laying as tolerated Continue airborne isolation precaution Inflammatory markers: LDH: 207 CRP: 2.5 D-dimer: 1.23 Ferritin: 271 Continue monitoring daily inflammatory markers Physician PPE:  Surgical mask with face shield, N-95, nonsterile gloves, disposable gown, head and shoe cover s Patient PPE:  Face mask    Nausea and vomiting Continue Zofran 4 mg every 6 hours as needed  Chronic kidney disease stage IIIb BUN to creatinine 28/1.84 (no recent labs for comparison, most recent lab was creatinine of 2.1-2.3 about 3 to 4 years ago) Renally adjust medications, avoid nephrotoxic agents/dehydration/hypotension  History of heart and kidney transplant Stable  DVT prophylaxis: Heparin subcu  Code Status: Full code  Family Communication: None at bedside  Disposition Plan:  Patient is from:                        home Anticipated DC to:                   home Anticipated DC date:               2-3 days Anticipated DC barriers:           Patient is unstable to be discharged at this time due to COVID-19 virus infection  Consults called: None  Admission status: Inpatient  Bernadette Hoit MD [facility]  11/20/2020, 5:51 AM

## 2020-11-20 NOTE — ED Notes (Signed)
Resp therapist called for inhaler.

## 2020-11-20 NOTE — Care Management Obs Status (Signed)
[facility] NOTIFICATION   Patient Details  Name: Suda Forbess MRN: [id number] Date of Birth: 04-20-1986   Medicare Observation Status Notification Given:  Yes    Natasha Bence, LCSW 11/20/2020, 12:16 PM

## 2020-11-20 NOTE — ED Notes (Signed)
Blood work obtained and walked to lab

## 2020-11-20 NOTE — Discharge Summary (Signed)
Physician Discharge Summary  Diana [name] WJX:[id number] DOB: 02/28/1986 DOA: 11/19/2020  PCP: Patient, No Pcp Per  Admit date: 11/19/2020  Discharge date: 11/20/2020  Admitted From:Home  Disposition:  Home  Recommendations for Outpatient Follow-up:  1. Follow up with PCP in 1-2 weeks 2. Continue on Decadron as prescribed as well as albuterol inhaler Robitussin as needed for symptoms 3. Continue with home medications as prior  Home Health: None  Equipment/Devices: None  Discharge Condition:Stable  CODE STATUS: Full  Diet recommendation: Heart Healthy  Brief/Interim Summary: Per HPI: Diana [name] is a 35 y.o. female with medical history significant for systolic CHF s/p Heart and right kidney transplant who presents to the emergency department due to about 4-day onset of generalized body aches, fatigue, myalgia, cough, nausea and one episode of nonbloody vomiting.  Patient endorsed diarrhea 2 days ago which lasted for about 24 hours with about 6-7 watery stools.  Last BM was yesterday in the morning (11/19/2020).  She complained of decreased appetite, but denies loss of taste or smell.  Patient states that she has had 2 vaccines and a booster.  She lives alone and denies having any sick contact.  -Patient was admitted with shortness of breath secondary to COVID-19 virus infection and was started on remdesivir and Decadron as well as supplemental treatments with inhalers and antitussives.  She is also noted to have some nausea and vomiting, but this has improved considerably and she is now tolerating at least full liquids with much less nausea noted.  She appears to be in stable condition for discharge and has much less shortness of breath and has remained on room air.  She will remain on Decadron for several more days as prescribed and is to continue another home medications as prior.  No other acute events noted throughout the course of this admission.  She is stable for  discharge.  Discharge Diagnoses:  Principal Problem:   COVID-19 virus infection Active Problems:   Obesity, unspecified   Nausea & vomiting   Shortness of breath   Chronic kidney disease   History of heart transplant (Diana [name])   History of kidney transplant  Principal discharge diagnosis: Dyspnea secondary to COVID-19 infection.  Discharge Instructions  Discharge Instructions    Diet - low sodium heart healthy   Complete by: As directed    Increase activity slowly   Complete by: As directed      Allergies as of 11/20/2020   No Known Allergies     Medication List    TAKE these medications   acetaminophen 500 MG tablet Commonly known as: TYLENOL Take 1,000 mg by mouth daily as needed for moderate pain.   albuterol 108 (90 Base) MCG/ACT inhaler Commonly known as: VENTOLIN HFA Inhale 2 puffs into the lungs every 6 (six) hours as needed for wheezing or shortness of breath.   ascorbic acid 500 MG tablet Commonly known as: VITAMIN C Take 1 tablet (500 mg total) by mouth daily for 15 days. Start taking on: November 21, 2020   carvedilol 6.25 MG tablet Commonly known as: COREG Take 1 tablet (6.25 mg total) by mouth 2 (two) times daily with a meal.   dexamethasone 6 MG tablet Commonly known as: DECADRON Take 1 tablet (6 mg total) by mouth daily for 8 days.   guaiFENesin-dextromethorphan 100-10 MG/5ML syrup Commonly known as: ROBITUSSIN DM Take 10 mLs by mouth every 4 (four) hours as needed for cough.   ondansetron 4 MG disintegrating tablet Commonly known as: Zofran ODT  4mg  ODT q4 hours prn nausea/vomit   pantoprazole 40 MG tablet Commonly known as: Protonix Take 1 tablet (40 mg total) by mouth daily for 15 days.   zinc sulfate 220 (50 Zn) MG capsule Take 1 capsule (220 mg total) by mouth daily for 15 days. Start taking on: November 21, 2020       Follow-up Information    pcp. Schedule an appointment as soon as possible for a visit in 2 week(s).               No Known Allergies  Consultations:  None   Procedures/Studies: DG Chest Port 1 View  Result Date: 11/20/2020 CLINICAL DATA:  COVID-19.  Cough. EXAM: PORTABLE CHEST 1 VIEW COMPARISON:  08/08/2014 FINDINGS: Mild cardiomegaly. Hazy opacity at the left lung base may be partially due to overlying soft tissue. Otherwise, the lungs are clear. No pleural effusion or pneumothorax. Remote median sternotomy. IMPRESSION: Hazy opacity at the left lung base may be partially due to overlying soft tissue, though developing consolidation would be difficult to exclude. PA and lateral radiographs may be helpful for further characterization. Electronically Signed   By: Ulyses Jarred M.D.   On: 11/20/2020 00:11      Discharge Exam: Vitals:   11/20/20 0800 11/20/20 0912  BP: 119/77   Pulse: 81   Resp: 15   Temp:    SpO2: 98% 100%   Vitals:   11/20/20 0700 11/20/20 0730 11/20/20 0800 11/20/20 0912  BP: 101/80 104/64 119/77   Pulse: 80 80 81   Resp: (!) 22 14 15    Temp:      TempSrc:      SpO2: 100% 100% 98% 100%  Weight:      Height:        General: Pt is alert, awake, not in acute distress Cardiovascular: RRR, S1/S2 +, no rubs, no gallops Respiratory: CTA bilaterally, no wheezing, no rhonchi Abdominal: Soft, NT, ND, bowel sounds + Extremities: no edema, no cyanosis    The results of significant diagnostics from this hospitalization (including imaging, microbiology, ancillary and laboratory) are listed below for reference.     Microbiology: Recent Results (from the past 240 hour(s))  Resp Panel by RT-PCR (Flu A&B, Covid) Nasopharyngeal Swab     Status: Abnormal   Collection Time: 11/19/20  9:52 PM   Specimen: Nasopharyngeal Swab; Nasopharyngeal(NP) swabs in vial transport medium  Result Value Ref Range Status   SARS Coronavirus 2 by RT PCR POSITIVE (A) NEGATIVE Final    Comment: RESULT CALLED TO, READ BACK BY AND VERIFIED WITH: WALKER,T @ 0160 ON 11/19/20 BY JUW (NOTE) SARS-CoV-2  target nucleic acids are DETECTED.  The SARS-CoV-2 RNA is generally detectable in upper respiratory specimens during the acute phase of infection. Positive results are indicative of the presence of the identified virus, but do not rule out bacterial infection or co-infection with other pathogens not detected by the test. Clinical correlation with patient history and other diagnostic information is necessary to determine patient infection status. The expected result is Negative.  Fact Sheet for Patients: EntrepreneurPulse.com.au  Fact Sheet for Healthcare Providers: IncredibleEmployment.be  This test is not yet approved or cleared by the [place] FDA and  has been authorized for detection and/or diagnosis of SARS-CoV-2 by FDA under an Emergency Use Authorization (EUA).  This EUA will remain in effect (meaning this test can be  used) for the duration of  the COVID-19 declaration under Section 564(b)(1) of the Act, 21 U.S.C. section 360bbb-3(b)(1),  unless the authorization is terminated or revoked sooner.     Influenza A by PCR NEGATIVE NEGATIVE Final   Influenza B by PCR NEGATIVE NEGATIVE Final    Comment: (NOTE) The Xpert Xpress SARS-CoV-2/FLU/RSV plus assay is intended as an aid in the diagnosis of influenza from Nasopharyngeal swab specimens and should not be used as a sole basis for treatment. Nasal washings and aspirates are unacceptable for Xpert Xpress SARS-CoV-2/FLU/RSV testing.  Fact Sheet for Patients: EntrepreneurPulse.com.au  Fact Sheet for Healthcare Providers: IncredibleEmployment.be  This test is not yet approved or cleared by the [place] FDA and has been authorized for detection and/or diagnosis of SARS-CoV-2 by FDA under an Emergency Use Authorization (EUA). This EUA will remain in effect (meaning this test can be used) for the duration of the COVID-19 declaration under Section  564(b)(1) of the Act, 21 U.S.C. section 360bbb-3(b)(1), unless the authorization is terminated or revoked.  Performed at [facility], [address], [place], [place] [address]      Labs: BNP (last 3 results) No results for input(s): BNP in the last 8760 hours. Basic Metabolic Panel: Recent Labs  Lab 11/19/20 0141 11/20/20 0659  NA 137  --   K 3.9  --   CL 102  --   CO2 23  --   GLUCOSE 113*  --   BUN 28*  --   CREATININE 1.84* 1.85*  CALCIUM 9.0  --    Liver Function Tests: Recent Labs  Lab 11/19/20 0141  AST 23  ALT 13  ALKPHOS 114  BILITOT 0.6  PROT 8.0  ALBUMIN 3.8   No results for input(s): LIPASE, AMYLASE in the last 168 hours. No results for input(s): AMMONIA in the last 168 hours. CBC: Recent Labs  Lab 11/19/20 0141 11/20/20 0659  WBC 7.9 6.6  NEUTROABS 5.4  --   HGB 12.4 11.4*  HCT 36.3 34.8*  MCV 87.1 88.3  PLT 268 230   Cardiac Enzymes: No results for input(s): CKTOTAL, CKMB, CKMBINDEX, TROPONINI in the last 168 hours. BNP: Invalid input(s): POCBNP CBG: Recent Labs  Lab 11/19/20 2227 11/20/20 0735  GLUCAP 186* 240*   D-Dimer Recent Labs    11/19/20 0141  DDIMER 1.23*   Hgb A1c No results for input(s): HGBA1C in the last 72 hours. Lipid Profile Recent Labs    11/19/20 0141  TRIG 196*   Thyroid function studies No results for input(s): TSH, T4TOTAL, T3FREE, THYROIDAB in the last 72 hours.  Invalid input(s): FREET3 Anemia work up Recent Labs    11/19/20 0141  FERRITIN 271   Urinalysis    Component Value Date/Time   COLORURINE YELLOW 12/23/2016 0303   APPEARANCEUR HAZY (A) 12/23/2016 0303   LABSPEC 1.017 12/23/2016 0303   PHURINE 6.0 12/23/2016 0303   GLUCOSEU 150 (A) 12/23/2016 0303   HGBUR MODERATE (A) 12/23/2016 0303   BILIRUBINUR NEGATIVE 12/23/2016 0303   KETONESUR NEGATIVE 12/23/2016 0303   PROTEINUR >=300 (A) 12/23/2016 0303   UROBILINOGEN 0.2 08/22/2014 2319   NITRITE NEGATIVE 12/23/2016 0303    LEUKOCYTESUR TRACE (A) 12/23/2016 0303   Sepsis Labs Invalid input(s): PROCALCITONIN,  WBC,  LACTICIDVEN Microbiology Recent Results (from the past 240 hour(s))  Resp Panel by RT-PCR (Flu A&B, Covid) Nasopharyngeal Swab     Status: Abnormal   Collection Time: 11/19/20  9:52 PM   Specimen: Nasopharyngeal Swab; Nasopharyngeal(NP) swabs in vial transport medium  Result Value Ref Range Status   SARS Coronavirus 2 by RT PCR POSITIVE (A) NEGATIVE Final  Comment: RESULT CALLED TO, READ BACK BY AND VERIFIED WITH: WALKER,T @ 9233 ON 11/19/20 BY JUW (NOTE) SARS-CoV-2 target nucleic acids are DETECTED.  The SARS-CoV-2 RNA is generally detectable in upper respiratory specimens during the acute phase of infection. Positive results are indicative of the presence of the identified virus, but do not rule out bacterial infection or co-infection with other pathogens not detected by the test. Clinical correlation with patient history and other diagnostic information is necessary to determine patient infection status. The expected result is Negative.  Fact Sheet for Patients: EntrepreneurPulse.com.au  Fact Sheet for Healthcare Providers: IncredibleEmployment.be  This test is not yet approved or cleared by the [place] FDA and  has been authorized for detection and/or diagnosis of SARS-CoV-2 by FDA under an Emergency Use Authorization (EUA).  This EUA will remain in effect (meaning this test can be  used) for the duration of  the COVID-19 declaration under Section 564(b)(1) of the Act, 21 U.S.C. section 360bbb-3(b)(1), unless the authorization is terminated or revoked sooner.     Influenza A by PCR NEGATIVE NEGATIVE Final   Influenza B by PCR NEGATIVE NEGATIVE Final    Comment: (NOTE) The Xpert Xpress SARS-CoV-2/FLU/RSV plus assay is intended as an aid in the diagnosis of influenza from Nasopharyngeal swab specimens and should not be used as a sole basis  for treatment. Nasal washings and aspirates are unacceptable for Xpert Xpress SARS-CoV-2/FLU/RSV testing.  Fact Sheet for Patients: EntrepreneurPulse.com.au  Fact Sheet for Healthcare Providers: IncredibleEmployment.be  This test is not yet approved or cleared by the [place] FDA and has been authorized for detection and/or diagnosis of SARS-CoV-2 by FDA under an Emergency Use Authorization (EUA). This EUA will remain in effect (meaning this test can be used) for the duration of the COVID-19 declaration under Section 564(b)(1) of the Act, 21 U.S.C. section 360bbb-3(b)(1), unless the authorization is terminated or revoked.  Performed at [facility], [address], [place], [place] [address]      Time coordinating discharge: 35 minutes  SIGNED:   Rodena Goldmann, DO [facility] 11/20/2020, 10:46 AM  If 7PM-7AM, please contact night-coverage [email or web address]

## 2020-11-20 NOTE — Care Management CC44 (Signed)
Condition Code 44 Documentation Completed  Patient Details  Name: Everette Dimauro MRN: [id number] Date of Birth: 1986/01/25   Condition Code 44 given:  Yes Patient signature on Condition Code 44 notice:  Yes Documentation of 2 MD's agreement:  Yes Code 44 added to claim:  Yes    Natasha Bence, LCSW 11/20/2020, 12:16 PM

## 2022-07-02 ENCOUNTER — Encounter (HOSPITAL_COMMUNITY): Payer: Self-pay

## 2022-07-02 ENCOUNTER — Emergency Department (HOSPITAL_COMMUNITY)
Admission: EM | Admit: 2022-07-02 | Discharge: 2022-07-02 | Disposition: A | Discharge status: Home / Self Care | Payer: 59 | Attending: Emergency Medicine | Admitting: Emergency Medicine

## 2022-07-02 ENCOUNTER — Other Ambulatory Visit: Payer: Self-pay

## 2022-07-02 DIAGNOSIS — I509 Heart failure, unspecified: Secondary | ICD-10-CM | POA: Diagnosis not present

## 2022-07-02 DIAGNOSIS — M549 Dorsalgia, unspecified: Secondary | ICD-10-CM | POA: Diagnosis present

## 2022-07-02 DIAGNOSIS — E119 Type 2 diabetes mellitus without complications: Secondary | ICD-10-CM | POA: Insufficient documentation

## 2022-07-02 DIAGNOSIS — M545 Low back pain, unspecified: Secondary | ICD-10-CM | POA: Insufficient documentation

## 2022-07-02 MED ORDER — CYCLOBENZAPRINE HCL 10 MG PO TABS: 10.0000 mg | ORAL_TABLET | Freq: Two times a day (BID) | ORAL | 0 refills | Status: AC | PRN

## 2022-07-02 MED ORDER — LIDOCAINE 5 % EX PTCH
1.0000 | MEDICATED_PATCH | CUTANEOUS | Status: DC
  Administered 2022-07-02: 1 via TRANSDERMAL
  Filled 2022-07-02: qty 1

## 2022-07-02 MED ORDER — CYCLOBENZAPRINE HCL 10 MG PO TABS
10.0000 mg | ORAL_TABLET | Freq: Once | ORAL | Status: AC
  Administered 2022-07-02: 10 mg via ORAL
  Filled 2022-07-02: qty 1

## 2022-07-02 MED ORDER — ACETAMINOPHEN 500 MG PO TABS
1000.0000 mg | ORAL_TABLET | Freq: Four times a day (QID) | ORAL | Status: DC | PRN
  Administered 2022-07-02: 1000 mg via ORAL
  Filled 2022-07-02: qty 2

## 2022-07-02 NOTE — ED Provider Notes (Signed)
[facility] EMERGENCY DEPARTMENT Provider Note   CSN: [id number] Arrival date & time: 07/02/22  1900     History  Chief Complaint  Patient presents with   Back Pain    Diana [name] is a 36 y.o. female with medical history of acute kidney injury, CHF, diabetes, recent kidney transplant currently on chronic prednisone.  The patient presents to ED for evaluation of back pain.  Patient reports that for the last 2 weeks she has had centralized lumbar spinal pain that does not radiate.  The patient states that a few years ago she was seen by her doctors at [facility] and told that she had a bulging disc in her spine.  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  Patient states that 2 weeks ago when the pain began, she had not been exercising or doing any physical exertion.  The patient denies any acute event to account for this pain in her low back.  Patient states since this time she has tried lying flat on her bed, Tylenol without relief.  Patient states that she is also trialed Percocet which has not helped her pain.  The patient states that she is chronically on steroids due to recent kidney transplant.  The patient denies any fevers, IV drug use, bowel or bladder incontinence, lower extremity weakness, groin numbness.   Back Pain Associated symptoms: no fever, no numbness and no weakness        Home Medications Prior to Admission medications   Medication Sig Start Date End Date Taking? Authorizing Provider  cyclobenzaprine (FLEXERIL) 10 MG tablet Take 1 tablet (10 mg total) by mouth 2 (two) times daily as needed for muscle spasms. 07/02/22  Yes Azucena Cecil, PA-C  acetaminophen (TYLENOL) 500 MG tablet Take 1,000 mg by mouth daily as needed for moderate pain.    [provider]   albuterol (VENTOLIN HFA) 108 (90 Base) MCG/ACT inhaler Inhale 2 puffs into the lungs every 6 (six) hours as needed for wheezing or shortness of breath. 11/20/20   Manuella Ghazi, Pratik D, DO  carvedilol (COREG) 6.25 MG tablet Take 1 tablet (6.25 mg total) by mouth 2 (two) times daily with a meal. 08/19/14   Black, Lezlie Octave, NP  guaiFENesin-dextromethorphan (ROBITUSSIN DM) 100-10 MG/5ML syrup Take 10 mLs by mouth every 4 (four) hours as needed for cough. 11/20/20   Manuella Ghazi, Pratik D, DO  ondansetron (ZOFRAN ODT) 4 MG disintegrating tablet 4mg  ODT q4 hours prn nausea/vomit 11/20/20   Manuella Ghazi, Pratik D, DO  pantoprazole (PROTONIX) 40 MG tablet Take 1 tablet (40 mg total) by mouth daily for 15 days. 11/20/20 12/05/20  Heath Lark D, DO      Allergies    Hydrocodone    Review of Systems   Review of Systems  Constitutional:  Negative for chills and fever.  Musculoskeletal:  Positive for back pain.  Neurological:  Negative for weakness and numbness.  All other systems reviewed and are negative.   Physical Exam Updated Vital Signs BP 107/81 (BP Location: Right Arm)   Pulse 98   Temp 97.8 F (36.6 C)   Resp 16   Ht 5\' 2"  (1.575 m)   Wt 77.6 kg   SpO2 100%   BMI 31.28 kg/m  Physical Exam Vitals and nursing note reviewed.  Constitutional:  General: She is not in acute distress.    Appearance: Normal appearance. She is not ill-appearing, toxic-appearing or diaphoretic.  HENT:     Head: Normocephalic and atraumatic.     Nose: Nose normal. No congestion.     Mouth/Throat:     Mouth: Mucous membranes are moist.     Pharynx: Oropharynx is clear.  Eyes:     Extraocular Movements: Extraocular movements intact.     Conjunctiva/sclera: Conjunctivae normal.     Pupils: Pupils are equal, round, and reactive to light.  Cardiovascular:     Rate and Rhythm: Normal rate and regular rhythm.  Pulmonary:     Effort: Pulmonary effort is normal.     Breath sounds: Normal breath sounds. No wheezing.  Abdominal:      General: Abdomen is flat. Bowel sounds are normal.     Palpations: Abdomen is soft.     Tenderness: There is no abdominal tenderness.  Musculoskeletal:     Cervical back: Normal range of motion and neck supple. No tenderness.  Skin:    General: Skin is warm and dry.     Capillary Refill: Capillary refill takes less than 2 seconds.  Neurological:     General: No focal deficit present.     Mental Status: She is alert and oriented to person, place, and time.     GCS: GCS eye subscore is 4. GCS verbal subscore is 5. GCS motor subscore is 6.     Cranial Nerves: Cranial nerves 2-12 are intact. No cranial nerve deficit.     Sensory: Sensation is intact. No sensory deficit.     Motor: Motor function is intact. No weakness.     Coordination: Coordination is intact. Heel to [facility] Test normal.     ED Results / Procedures / Treatments   Labs (all labs ordered are listed, but only abnormal results are displayed) Labs Reviewed - No data to display  EKG None  Radiology No results found.  Procedures Procedures   Medications Ordered in ED Medications  lidocaine (LIDODERM) 5 % 1 patch (1 patch Transdermal Patch Applied 07/02/22 2048)  acetaminophen (TYLENOL) tablet 1,000 mg (1,000 mg Oral Given 07/02/22 2048)  cyclobenzaprine (FLEXERIL) tablet 10 mg (10 mg Oral Given 07/02/22 2048)    ED Course/ Medical Decision Making/ A&P                           Medical Decision Making Risk OTC drugs. Prescription drug management.   36 year old female presents to ED for evaluation of low back pain.  Please see HPI for further details.  On examination, the patient is afebrile and nontachycardic.  The patient lung sounds are clear bilaterally.  The patient neurological examination shows no focal neurodeficits.  The patient does have centralized lumbar spinal tenderness without step-off, deformity or crepitus noted.  This pain does not radiate.  The patient was told that she had a bulging disc in her  back at some point by a doctor at [facility].  The patient states that she has attempted trialing physical therapy with positive results.  Patient states that she just had her first physical therapy appointment 1 week ago and is set to go back in 1 week.  The patient will be sent home with muscle relaxers and advised to continue taking Tylenol for relief.  The patient will be advised to purchase Salonpas patches over-the-counter.  Patient reports that she is chronically on steroids, has already tried taking  narcotic pain medication to control pain.  Due to this, we will not explore these avenues of treatment options here today.  The patient was counseled on red flag symptoms of low back pain and she voiced understanding.  The patient was given return precautions and she voiced understanding.  The patient had all of her questions answered to her satisfaction prior to discharge.  The patient is stable this time for discharge home.  Final Clinical Impression(s) / ED Diagnoses Final diagnoses:  Midline low back pain without sciatica, unspecified chronicity    Rx / DC Orders ED Discharge Orders          Ordered    cyclobenzaprine (FLEXERIL) 10 MG tablet  2 times daily PRN        07/02/22 2048              Azucena Cecil, PA-C 07/02/22 2116    Godfrey Pick, MD 07/05/22 [id number]

## 2022-07-02 NOTE — Discharge Instructions (Addendum)
Please return to the ED with any new symptoms such as inability to control your bowels or bladder, lower extremity weakness, groin numbness Please follow-up with your PCP and physical therapy team for further management Please pick up prescription for muscle relaxers that I have sent in Please continue utilizing Tylenol, ice and heat packs for pain control Please read the attached guide concerning back pain

## 2022-07-02 NOTE — ED Triage Notes (Signed)
Complaining of back pain that started a couple of days ago. Has been to [facility] and has been told she has a herniated disk in her spine. Pain is becoming intolerable.
